# Patient Record
Sex: Female | Born: 1980
Health system: Southern US, Community
[De-identification: ages and names within clinical notes are randomized; demographics above are authoritative.]

## PROBLEM LIST (undated history)

## (undated) DIAGNOSIS — R109 Unspecified abdominal pain: Secondary | ICD-10-CM

## (undated) DIAGNOSIS — F329 Major depressive disorder, single episode, unspecified: Secondary | ICD-10-CM

## (undated) DIAGNOSIS — D649 Anemia, unspecified: Secondary | ICD-10-CM

## (undated) DIAGNOSIS — T7840XA Allergy, unspecified, initial encounter: Secondary | ICD-10-CM

## (undated) DIAGNOSIS — F32A Depression, unspecified: Secondary | ICD-10-CM

## (undated) DIAGNOSIS — R58 Hemorrhage, not elsewhere classified: Secondary | ICD-10-CM

## (undated) HISTORY — DX: Major depressive disorder, single episode, unspecified: F32.9

## (undated) HISTORY — DX: Anemia, unspecified: D64.9

## (undated) HISTORY — DX: Allergy, unspecified, initial encounter: T78.40XA

## (undated) HISTORY — PX: BONE MARROW HARVEST: SHX896

## (undated) HISTORY — PX: TUBAL LIGATION: SHX77

## (undated) HISTORY — DX: Depression, unspecified: F32.A

---

## 1999-06-03 ENCOUNTER — Emergency Department (HOSPITAL_COMMUNITY): Admission: EM | Admit: 1999-06-03 | Discharge: 1999-06-03 | Payer: Self-pay | Admitting: Emergency Medicine

## 2000-04-09 ENCOUNTER — Inpatient Hospital Stay (HOSPITAL_COMMUNITY): Admission: AD | Admit: 2000-04-09 | Discharge: 2000-04-09 | Payer: Self-pay | Admitting: Obstetrics

## 2000-08-27 ENCOUNTER — Other Ambulatory Visit: Admission: RE | Admit: 2000-08-27 | Discharge: 2000-08-27 | Payer: Self-pay | Admitting: Obstetrics and Gynecology

## 2000-10-12 ENCOUNTER — Inpatient Hospital Stay (HOSPITAL_COMMUNITY): Admission: AD | Admit: 2000-10-12 | Discharge: 2000-10-12 | Payer: Self-pay | Admitting: Obstetrics and Gynecology

## 2001-02-26 ENCOUNTER — Inpatient Hospital Stay (HOSPITAL_COMMUNITY): Admission: AD | Admit: 2001-02-26 | Discharge: 2001-02-28 | Payer: Self-pay | Admitting: Obstetrics and Gynecology

## 2001-08-05 ENCOUNTER — Emergency Department (HOSPITAL_COMMUNITY): Admission: EM | Admit: 2001-08-05 | Discharge: 2001-08-05 | Payer: Self-pay

## 2001-08-27 ENCOUNTER — Encounter: Admission: RE | Admit: 2001-08-27 | Discharge: 2001-11-25 | Payer: Self-pay | Admitting: Sports Medicine

## 2003-02-06 ENCOUNTER — Encounter: Payer: Self-pay | Admitting: Emergency Medicine

## 2003-02-06 ENCOUNTER — Inpatient Hospital Stay (HOSPITAL_COMMUNITY): Admission: EM | Admit: 2003-02-06 | Discharge: 2003-02-08 | Payer: Self-pay

## 2003-02-07 ENCOUNTER — Encounter: Payer: Self-pay | Admitting: General Surgery

## 2003-02-25 ENCOUNTER — Encounter: Payer: Self-pay | Admitting: Emergency Medicine

## 2003-02-25 ENCOUNTER — Emergency Department (HOSPITAL_COMMUNITY): Admission: EM | Admit: 2003-02-25 | Discharge: 2003-02-26 | Payer: Self-pay | Admitting: Emergency Medicine

## 2003-03-03 ENCOUNTER — Encounter: Admission: RE | Admit: 2003-03-03 | Discharge: 2003-06-01 | Payer: Self-pay | Admitting: Sports Medicine

## 2003-08-19 ENCOUNTER — Inpatient Hospital Stay (HOSPITAL_COMMUNITY): Admission: AD | Admit: 2003-08-19 | Discharge: 2003-08-19 | Payer: Self-pay | Admitting: Obstetrics

## 2006-09-06 ENCOUNTER — Inpatient Hospital Stay (HOSPITAL_COMMUNITY): Admission: AD | Admit: 2006-09-06 | Discharge: 2006-09-08 | Payer: Self-pay | Admitting: Obstetrics

## 2007-04-07 ENCOUNTER — Inpatient Hospital Stay (HOSPITAL_COMMUNITY): Admission: AD | Admit: 2007-04-07 | Discharge: 2007-04-10 | Payer: Self-pay | Admitting: Obstetrics

## 2007-04-09 ENCOUNTER — Encounter (INDEPENDENT_AMBULATORY_CARE_PROVIDER_SITE_OTHER): Payer: Self-pay | Admitting: Obstetrics

## 2010-11-08 NOTE — Op Note (Signed)
NAMESAHALIE, BETH NO.:  [id number]   MEDICAL RECORD NO.:  [id number]          PATIENT TYPE:  INP   LOCATION:  9148                          FACILITY:  [facility]   PHYSICIAN:  Kathreen Cosier, M.D.DATE OF BIRTH:  1981/03/10   DATE OF PROCEDURE:  04/09/2007  DATE OF DISCHARGE:                               OPERATIVE REPORT   PREOPERATIVE DIAGNOSES:  Multiparity.   PROCEDURE:  Postprocedure tubal ligation.   Using epidural, the patient in supine position, abdomen prepped and  draped, bladder emptied with straight catheter.  Midline subumbilical  incision 1 inch long was made, carried down to the fascia, the fascia  cleaned, grasped with 2 Kochers.  Fascia and the peritoneum opened with  the Mayo scissors.  Left tube grasped in the mid portion with the  Babcock clamp.  Tube traced to the fimbria, 0 plain suture placed in the  mesosalpinx below the portion of tube within the clamp.  This was tied  and approximately 1 inch of tube transected, hemostasis satisfactory.  Procedure done in a similar fashion on the other side.  Lap and sponge  counts correct.  Abdomen closed in layers.  Peritoneum and fascia  continuous with 2-0 Dexon.  Skin closed with subcuticular stitch of 4-0  Monocryl.  The patient tolerated the procedure well, taken to the  recovery room in good condition.           ______________________________  Kathreen Cosier, M.D.     BAM/MEDQ  D:  04/09/2007  T:  04/09/2007  Job:  [id number]
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
## 2010-11-11 NOTE — H&P (Signed)
NAMEJAYANI, [name] NO.:  [id number]   MEDICAL RECORD NO.:  [id number]          PATIENT TYPE:  MAT   LOCATION:  MATC                          [facility]ILITY:  [facility]   PHYSICIAN:  Roseanna Rainbow, M.D.DATE OF BIRTH:  [birth year]   DATE OF ADMISSION:  09/06/2006  DATE OF DISCHARGE:                              HISTORY & PHYSICAL   CHIEF COMPLAINT:  The patient is a 30 year old with an EDC of April 12, 2007, with an intrauterine pregnancy at 8.6 weeks.  The patient is a  gravida 3, para 1, complaining of nausea and vomiting.   HISTORY OF PRESENT ILLNESS:  The patient gives a several-week history of  nausea and vomiting.  She has been prescribed Reglan for her pregnancy-  related nausea and vomiting.  She reports worsening of these symptoms  over the past several days.  She also reports mild abdominal cramping.  Her last solid p.o. intake was this morning.   REVIEW OF SYSTEMS:  GI:  Please see the above.  GU: Noncontributory.   MEDICATIONS:  Please see the medication reconciliation form.   PAST MEDICAL HISTORY:  She denies.   FAMILY HISTORY:  Questionable cancer.   SOCIAL HISTORY:  No drug abuse, current smoker, former drinker.   PAST OB-GYN HISTORY:  Noncontributory.   PAST SURGICAL HISTORY:  Bone marrow aspiration.   PHYSICAL EXAMINATION:  VITAL SIGNS: Weight 168.7 pounds.  Temperature  98.7, pulse 91, blood pressure 105/66, respirations 18.  ABDOMEN: Nontender.  PELVIC:  Exam deferred.   LABORATORY DATA:  Urinalysis: Specific gravity greater than 1.030,  ketones greater than 80.   ASSESSMENT:  1. Early pregnancy; hyperemesis gravidarum.  2. Dehydration.   PLAN:  1. Admission.  2. Will check CBC, CMET, prealbumin, daily weights.  3. IV hydration.  4. Antiemetics.  5. Nutrition consult.  6. Obstetrical ultrasound.      Roseanna Rainbow, M.D.  Electronically Signed     LAJ/MEDQ  D:  09/06/2006  T:  09/06/2006  Job:  [id number]
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
## 2010-11-11 NOTE — H&P (Signed)
[facility]  Patient:    [name], [name] Visit Number: [id number] MRN: [id number]          Service Type: Attending:  Naima A. Normand Sloop, M.D. Dictated by:   Nigel Bridgeman, C.N.M. Adm. Date:  02/26/01                           History and Physical  DATE OF BIRTH:                [birth year]  HISTORY OF PRESENT ILLNESS:   Ms. Glassberg is a 30 year old gravida 2, para 0-0-1-0 at 40-1/7 weeks who presents for induction secondary to SGA and one questionable late deceleration on NST today.  The pregnancy has been remarkable for: 1. SGA diagnosed at approximately 33 weeks. 2. History of 16-week loss. 3. First-trimester VB. 4. First-trimester spotting.  PRENATAL LABORATORY DATA:     Blood type B positive, Rh antibody negative. VDRL nonreactive.  Rubella titer positive.  Hepatitis B surface antigen negative.  HIV nonreactive.  Sickle cell test negative.  GC and chlamydia cultures normal.  Pap normal.  Glucose challenge normal.  AFP normal. Hemoglobin upon entering the practice was 12.1.  It was 12.6 at 26 weeks. Group B strep culture was negative at 36 weeks.  EDC of February 25, 2001, was established by last menstrual period and was in agreement with ultrasound at approximately 17 weeks.  HISTORY OF PRESENT ILLNESS:   The patient entered care at approximately 14 weeks.  She had an ultrasound done at 17 weeks which showed normal growth and development.  Her history was questionably remarkable for an incompetent cervix with her previous pregnancy; however, upon further questioning by the physicians in our practice, no clear evidence of incompetent cervix was noted. The cervix remained within normal limits throughout the early part of her pregnancy.  Glucola was normal.  She had an ultrasound at 35 weeks for size less than dates.  She was found to have normal fluid but growth at the 13-25th percentile.  Cervix was 3.3 cm.  A follow-up ultrasound was done at  38 weeks continuing to show SGA with estimated fetal weight in the 13th percentile. NSTs were done twice a week from that time point.  The cervix was 1 cm, 75%, vertex at a -1 station.  NST today showed reactivity but did have one questionable late deceleration with one contraction.  She had, otherwise, scattered irritability with no decelerations and positive reactivity.  The cervix today was favorable.  The decision was made to admit for induction.  OBSTETRICAL HISTORY:          In January 2000, she had a 16-week miscarriage. She reported a history of a questionable twin gestation at that pregnancy. However, she began to have bleeding and was informed that the baby had died. No other information was available.  She did have anemia with that pregnancy, and she had some vomiting as well.  PAST MEDICAL HISTORY:         She was Depo-Provera until February 2001.  She had chicken pox twice, at age 58 and age 18.  She reports the other usual childhood illnesses.  She does have a history of anemia.  She was a previous smoker prior to her pregnancy.  PAST SURGICAL HISTORY:        D&E in January 2000.  She was a bone-marrow donor in January 1999.  ALLERGIES:  IODINE, which causes HIVES.  FAMILY HISTORY:               Her father has hypertension.  Her mother has a history of leukemia.  Her sister has anemia.  Her father has asthma.  There is a history of borderline diabetes in her family.  Paternal grandmother had some type of cancer.  Her mother has a history of migraines.  GENETIC HISTORY:              Remarkable for her sister dying at age 36 weeks with some type of congenital heart disease.  SOCIAL HISTORY:               The patient is single.  The father of the baby is involved and supportive.  His name is Amador Cunas.  The patient has two years of college.  She is employed at [employer].  Her partner has three years of college.  The patient is African-American, of the  Catholic faith. She has been followed by the certified nurse midwife service at [facility].  She denies any alcohol, drug, or tobacco use since positive EPT.  PHYSICAL EXAMINATION:  VITAL SIGNS:                  Stable.  The patient is afebrile.  HEENT:                        Within normal limits.  LUNGS:                        Bilateral breath sounds are clear.  HEART:                        Regular rate and rhythm, without murmur.  BREASTS:                      Soft and nontender.  ABDOMEN:                      Fundal height is approximately 34 cm.  Estimated fetal weight is 5 pounds.  Uterine contractions are very occasional and mild.  PELVIC:                       Cervical exam is 1-2 cm, 80%, vertex, -1 station, with membranes intact.  Fetal heart rate in the office on NST was reactive with one deceleration on office NST; otherwise, no decelerations were noted.  There was some irritability noted of the uterus.  EXTREMITIES:                  Deep tendon reflexes are 2+ without clonus. There is a trace of edema noted.  IMPRESSION:                   1. Intrauterine pregnancy at 40-1/7 weeks.                               2. Small for gestational age.                               3. Questionable fetal heart rate changes.  4. Favorable cervix.  PLAN:                         1. Admit to birthing suite for consult with                                  Dr. Normand Sloop as attending physician.                               2. Routine certified nurse midwife orders.                               3. Plan Pitocin induction.  The risks and                                  benefits of induction were reviewed with the                                  patient, and she does wish to proceed with                                  this process. Dictated by:   Nigel Bridgeman, C.N.M. Attending:  Naima A. Dillard, M.D. DD:  02/26/01 TD:  02/26/01 Job:  [id number]

## 2011-04-06 LAB — CBC
HCT: 31.4 — ABNORMAL LOW
MCV: 97.4
Platelets: 171
Platelets: 210
RBC: 3.18 — ABNORMAL LOW
RBC: 3.55 — ABNORMAL LOW
WBC: 12 — ABNORMAL HIGH
WBC: 12.8 — ABNORMAL HIGH

## 2011-04-06 LAB — CCBB MATERNAL DONOR DRAW

## 2013-04-02 ENCOUNTER — Encounter: Payer: Self-pay | Admitting: Family

## 2013-04-02 ENCOUNTER — Ambulatory Visit (INDEPENDENT_AMBULATORY_CARE_PROVIDER_SITE_OTHER): Payer: BC Managed Care – PPO | Admitting: Family

## 2013-04-02 VITALS — BP 126/80 | HR 90 | Ht 66.0 in | Wt 147.0 lb

## 2013-04-02 DIAGNOSIS — D510 Vitamin B12 deficiency anemia due to intrinsic factor deficiency: Secondary | ICD-10-CM | POA: Insufficient documentation

## 2013-04-02 DIAGNOSIS — F329 Major depressive disorder, single episode, unspecified: Secondary | ICD-10-CM

## 2013-04-02 DIAGNOSIS — F411 Generalized anxiety disorder: Secondary | ICD-10-CM

## 2013-04-02 LAB — CBC WITH DIFFERENTIAL/PLATELET
Basophils Relative: 0.4 % (ref 0.0–3.0)
HCT: 38 % (ref 36.0–46.0)
Hemoglobin: 13 g/dL (ref 12.0–15.0)
Lymphocytes Relative: 23.7 % (ref 12.0–46.0)
Lymphs Abs: 1.6 10*3/uL (ref 0.7–4.0)
MCHC: 34.2 g/dL (ref 30.0–36.0)
Monocytes Relative: 10.6 % (ref 3.0–12.0)
Neutro Abs: 4.4 10*3/uL (ref 1.4–7.7)
RBC: 3.68 Mil/uL — ABNORMAL LOW (ref 3.87–5.11)

## 2013-04-02 LAB — HEPATIC FUNCTION PANEL
ALT: 58 U/L — ABNORMAL HIGH (ref 0–35)
AST: 55 U/L — ABNORMAL HIGH (ref 0–37)
Bilirubin, Direct: 0.3 mg/dL (ref 0.0–0.3)
Total Bilirubin: 2.6 mg/dL — ABNORMAL HIGH (ref 0.3–1.2)

## 2013-04-02 MED ORDER — PAROXETINE HCL 10 MG PO TABS: 10.0000 mg | ORAL_TABLET | ORAL | Status: DC

## 2013-04-02 MED ORDER — CHLORDIAZEPOXIDE HCL 5 MG PO CAPS: 5.0000 mg | ORAL_CAPSULE | Freq: Three times a day (TID) | ORAL | Status: DC | PRN

## 2013-04-02 MED ORDER — ONDANSETRON HCL 8 MG PO TABS: 8.0000 mg | ORAL_TABLET | Freq: Three times a day (TID) | ORAL | Status: DC | PRN

## 2013-04-02 NOTE — Patient Instructions (Addendum)

## 2013-04-02 NOTE — Progress Notes (Signed)
  Subjective:    Patient ID: [name], female    DOB: [birth year], 32 y.o.   MRN: [id number]  HPI 32 year old [place] American female, new patient to the practice and to be established. She has a history of anxiety, depression, alcohol dependence. She recently broke up with her boyfriend and is having a difficult time coping. In the past medications and suggested to her to take but she has not taken it. She has feelings of helplessness, hopelessness but denies any thoughts of death or dying or any intention to commit suicide.   Review of Systems  Constitutional: Negative.   Respiratory: Negative.   Cardiovascular: Negative.   Skin: Negative.   Neurological: Negative.   Psychiatric/Behavioral: Positive for sleep disturbance and agitation. The patient is nervous/anxious.    Past Medical History  Diagnosis Date  . Depression     History   Social History  . Marital Status: Single    Spouse Name: N/A    Number of Children: N/A  . Years of Education: N/A   Occupational History  . Not on file.   Social History Main Topics  . Smoking status: Never Smoker   . Smokeless tobacco: Not on file  . Alcohol Use: Yes  . Drug Use: No  . Sexual Activity: Not on file   Other Topics Concern  . Not on file   Social History Narrative  . No narrative on file    Past Surgical History  Procedure Laterality Date  . Tubal ligation      No family history on file.  Allergies  Allergen Reactions  . Iodine     No current outpatient prescriptions on file prior to visit.   No current facility-administered medications on file prior to visit.    BP 126/80  Pulse 90  Ht 5\' 6"  (1.676 m)  Wt 147 lb (66.679 kg)  BMI 23.74 kg/m2chart    Objective:   Physical Exam  Constitutional: She is oriented to person, place, and time. She appears well-developed and well-nourished.  Neck: Normal range of motion. Neck supple.  Cardiovascular: Normal rate, regular rhythm and normal heart sounds.    Pulmonary/Chest: Effort normal and breath sounds normal.  Musculoskeletal: Normal range of motion.  Neurological: She is alert and oriented to person, place, and time.  Skin: Skin is warm and dry.  Psychiatric: She has a normal mood and affect.          Assessment & Plan:  Assessment: 1. Anxiety 2. Depression 3. History of alcohol dependence  Plan: Paxil 10 mg one tablet daily. Librium 5 mg 3 times a day as needed. Zofran as needed for nausea. Patient the office with any questions or concerns. Recheck as scheduled, in 10 days and sooner if needed.

## 2013-04-03 ENCOUNTER — Other Ambulatory Visit: Payer: Self-pay

## 2013-04-04 ENCOUNTER — Telehealth: Payer: Self-pay | Admitting: Family

## 2013-04-04 NOTE — Telephone Encounter (Signed)
Abd [place] order needs to be change from "Limited" to "Complete".  She said can schedule the pt once the order is changed.  Thanks a bunch.  Archie Patten

## 2013-04-04 NOTE — Telephone Encounter (Signed)
Order changed.

## 2013-04-07 ENCOUNTER — Ambulatory Visit (INDEPENDENT_AMBULATORY_CARE_PROVIDER_SITE_OTHER)
Admission: RE | Admit: 2013-04-07 | Discharge: 2013-04-07 | Disposition: A | Discharge status: Home / Self Care | Payer: BC Managed Care – PPO | Source: Ambulatory Visit | Attending: Family | Admitting: Family

## 2013-04-07 ENCOUNTER — Ambulatory Visit
Admission: RE | Admit: 2013-04-07 | Discharge: 2013-04-07 | Disposition: A | Discharge status: Home / Self Care | Payer: BC Managed Care – PPO | Source: Ambulatory Visit | Attending: Family | Admitting: Family

## 2013-04-07 ENCOUNTER — Other Ambulatory Visit: Payer: Self-pay | Admitting: Family

## 2013-04-07 DIAGNOSIS — R160 Hepatomegaly, not elsewhere classified: Secondary | ICD-10-CM

## 2013-04-07 DIAGNOSIS — K769 Liver disease, unspecified: Secondary | ICD-10-CM

## 2013-04-07 MED ORDER — IOHEXOL 350 MG/ML SOLN
100.0000 mL | Freq: Once | INTRAVENOUS | Status: AC | PRN
  Administered 2013-04-07: 100 mL via INTRAVENOUS

## 2013-04-08 ENCOUNTER — Ambulatory Visit (HOSPITAL_BASED_OUTPATIENT_CLINIC_OR_DEPARTMENT_OTHER): Payer: BC Managed Care – PPO

## 2013-04-08 ENCOUNTER — Other Ambulatory Visit: Payer: Self-pay | Admitting: Geriatric Medicine

## 2013-04-08 ENCOUNTER — Other Ambulatory Visit: Payer: Self-pay | Admitting: Family

## 2013-04-08 ENCOUNTER — Other Ambulatory Visit: Payer: Self-pay

## 2013-04-08 ENCOUNTER — Telehealth: Payer: Self-pay | Admitting: Family

## 2013-04-08 DIAGNOSIS — R160 Hepatomegaly, not elsewhere classified: Secondary | ICD-10-CM

## 2013-04-08 DIAGNOSIS — R1900 Intra-abdominal and pelvic swelling, mass and lump, unspecified site: Secondary | ICD-10-CM

## 2013-04-08 NOTE — Telephone Encounter (Signed)
Noted  

## 2013-04-08 NOTE — Telephone Encounter (Signed)
[name] from [facility] called to inform you that patient cancelled her MRI for today. [name] believes it was due to money issues.

## 2013-10-21 ENCOUNTER — Encounter: Payer: Self-pay | Admitting: Family

## 2013-10-21 ENCOUNTER — Ambulatory Visit (INDEPENDENT_AMBULATORY_CARE_PROVIDER_SITE_OTHER): Payer: BC Managed Care – PPO | Admitting: Family

## 2013-10-21 VITALS — BP 118/64 | HR 78 | Temp 98.3°F | Ht 66.0 in | Wt 127.0 lb

## 2013-10-21 DIAGNOSIS — F430 Acute stress reaction: Secondary | ICD-10-CM

## 2013-10-21 NOTE — Progress Notes (Signed)
   Subjective:    Patient ID: [name], female    DOB: 07-27-1980, 33 y.o.   MRN: [id number]  HPI 33 year old [place] American female, nonsmoker is in today requesting FMLA paperwork to be completed on her behalf for missing work. She has a daughter with allergic rhinitis currently under the care of the allergist that has caused her to miss significant amount of work over the last 6 months. This is causing stress and fear of loosing her job at [employer].    Review of Systems  Constitutional: Negative.   Respiratory: Negative.   Cardiovascular: Negative.   Endocrine: Negative.   Genitourinary: Negative.   Musculoskeletal: Negative.   Skin: Negative.   Allergic/Immunologic: Negative.   Neurological: Negative.   Hematological: Negative.   Psychiatric/Behavioral: Positive for sleep disturbance. The patient is nervous/anxious.    Past Medical History  Diagnosis Date  . Depression     History   Social History  . Marital Status: Single    Spouse Name: N/A    Number of Children: N/A  . Years of Education: N/A   Occupational History  . Not on file.   Social History Main Topics  . Smoking status: Never Smoker   . Smokeless tobacco: Not on file  . Alcohol Use: Yes  . Drug Use: No  . Sexual Activity: Not on file   Other Topics Concern  . Not on file   Social History Narrative  . No narrative on file    Past Surgical History  Procedure Laterality Date  . Tubal ligation      No family history on file.  Allergies  Allergen Reactions  . Iodine     Current Outpatient Prescriptions on File Prior to Visit  Medication Sig Dispense Refill  . chlordiazePOXIDE (LIBRIUM) 5 MG capsule Take 1 capsule (5 mg total) by mouth 3 (three) times daily as needed for anxiety.  30 capsule  0  . ondansetron (ZOFRAN) 8 MG tablet Take 1 tablet (8 mg total) by mouth every 8 (eight) hours as needed for nausea.  30 tablet  0  . PARoxetine (PAXIL) 10 MG tablet Take 1 tablet (10 mg total) by  mouth every morning.  30 tablet  1   No current facility-administered medications on file prior to visit.    There were no vitals taken for this visit.chart    Objective:   Physical Exam  Constitutional: She is oriented to person, place, and time. She appears well-developed and well-nourished.  HENT:  Right Ear: External ear normal.  Left Ear: External ear normal.  Nose: Nose normal.  Mouth/Throat: Oropharynx is clear and moist.  Neck: Normal range of motion. Neck supple.  Cardiovascular: Normal rate, regular rhythm and normal heart sounds.   Pulmonary/Chest: Effort normal and breath sounds normal.  Neurological: She is alert and oriented to person, place, and time.  Skin: Skin is warm and dry.  Psychiatric: She has a normal mood and affect.          Assessment & Plan:  Assessment:  Acute Stress Reaction  Plan: FMLA paperwork to be completed and faxed based upon stress and her daughter's illness.

## 2014-01-13 IMAGING — US US ABDOMEN COMPLETE
1 series · 13 of 25 positions shown · non-contrast
Comparison: None

CLINICAL DATA: Elevated LFTs.

EXAM:
ULTRASOUND ABDOMEN COMPLETE

[Series 1: us abdomen complete · 0.32mm/px · 13 of 72 slices shown]
[im 1/72]
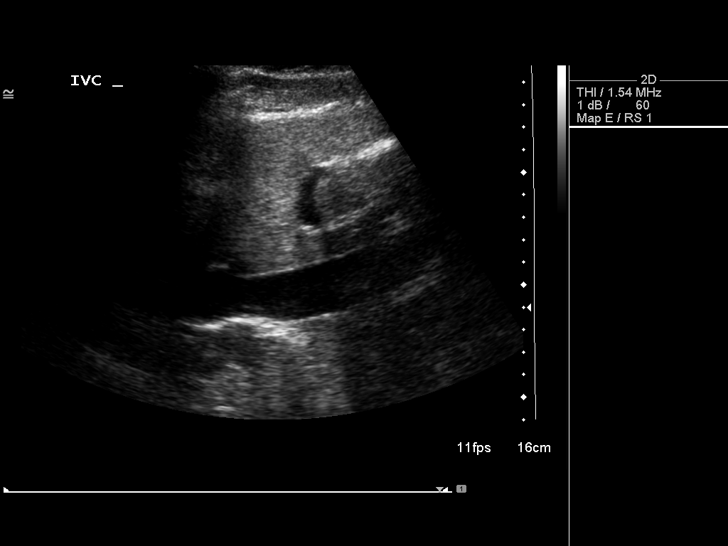
[im 6/72]
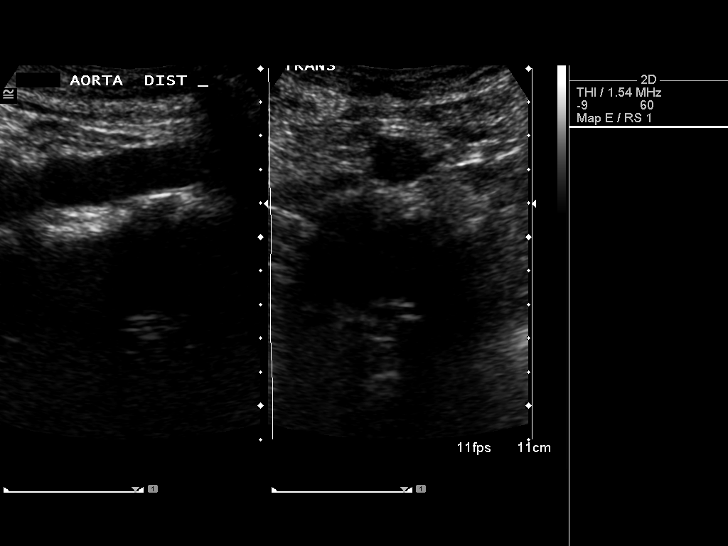
[im 12/72]
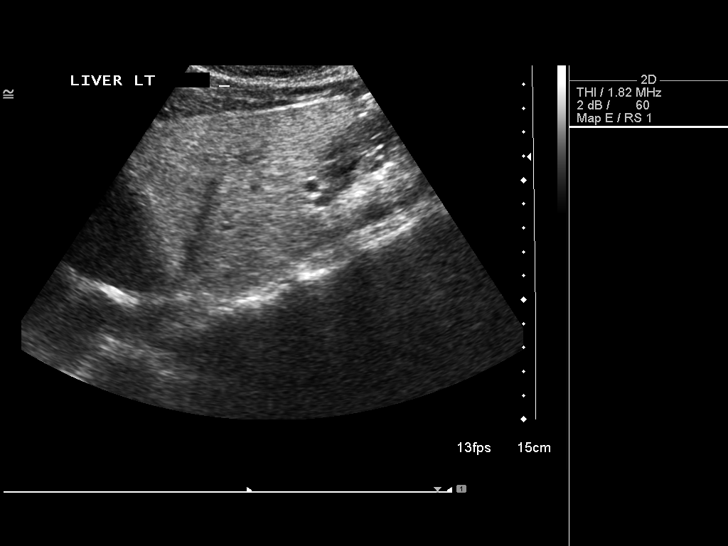
[im 18/72]
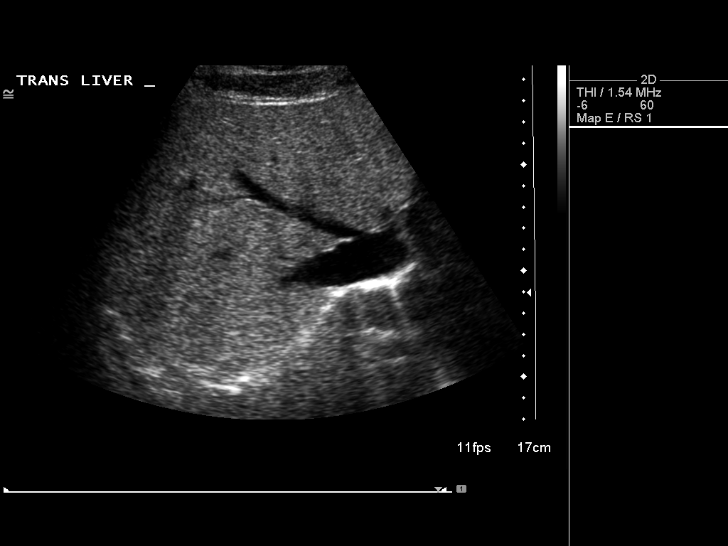
[im 24/72]
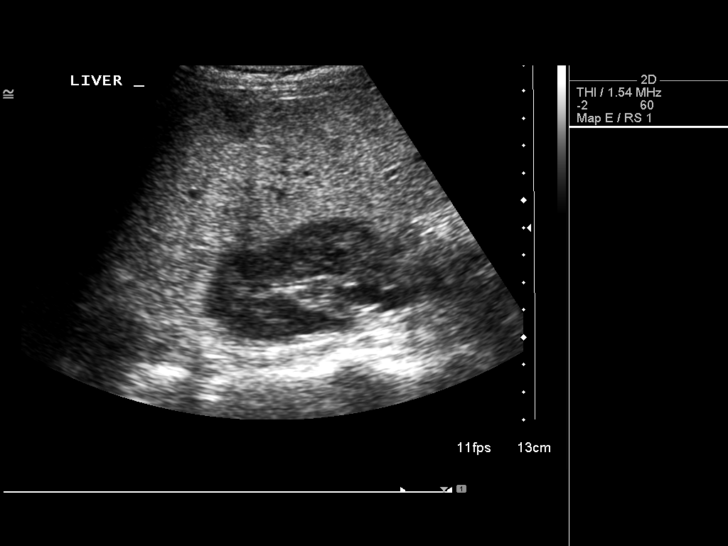
[im 30/72]
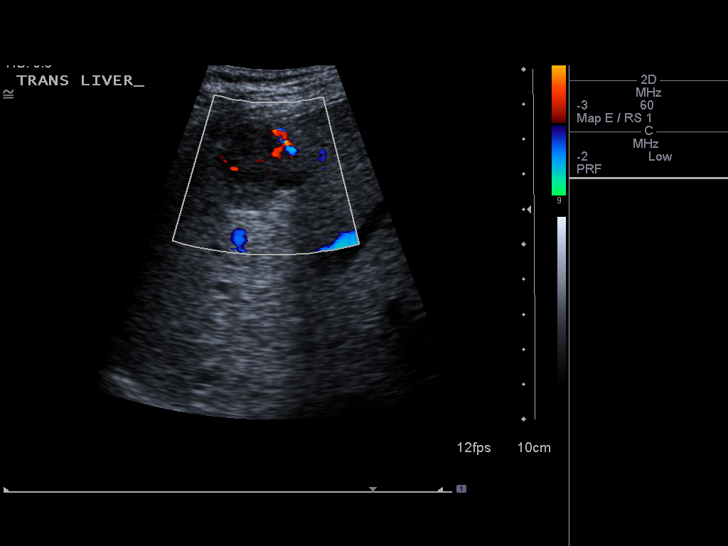
[im 36/72]
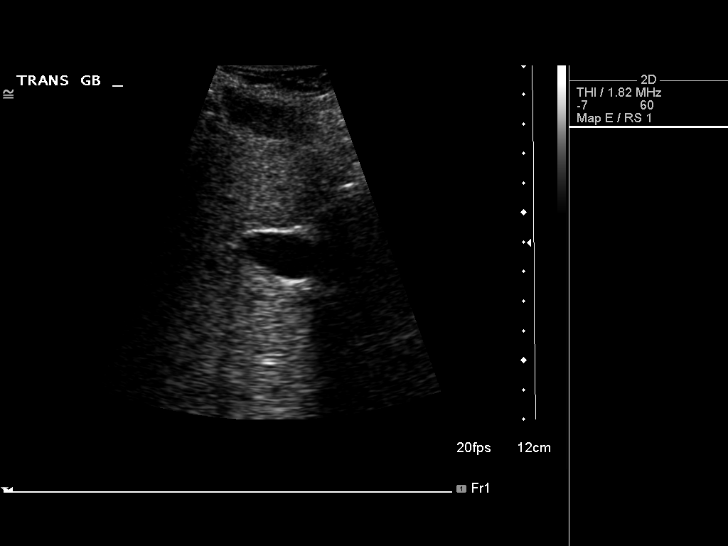
[im 42/72]
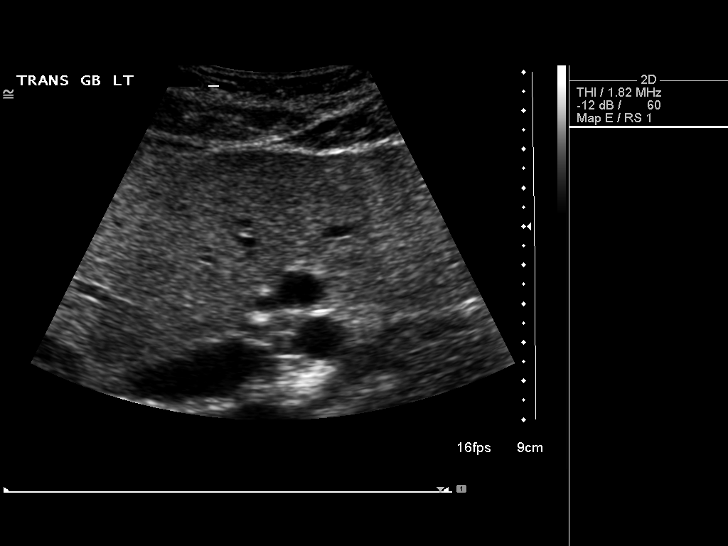
[im 48/72]
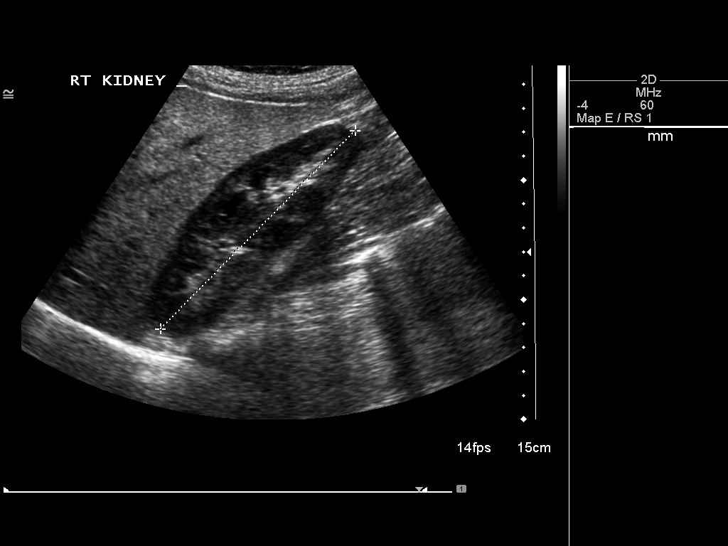
[im 54/72]
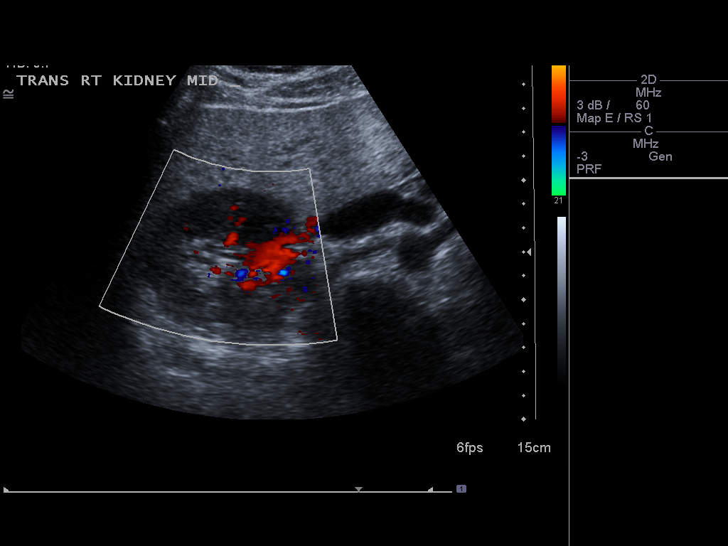
[im 60/72]
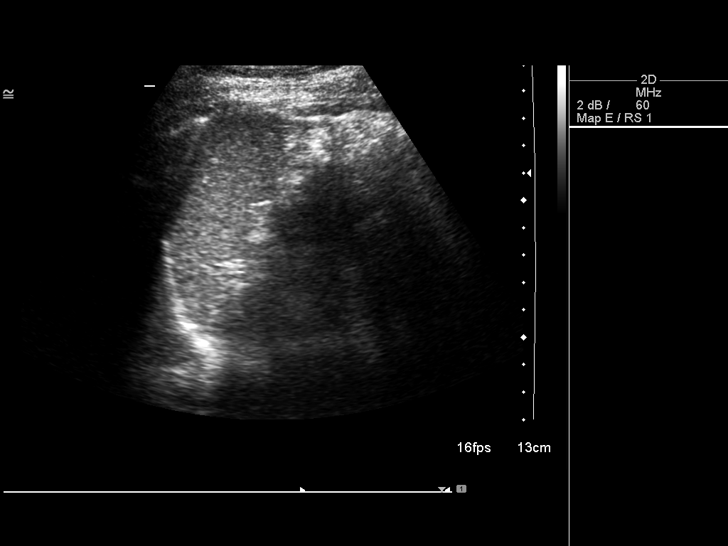
[im 66/72]
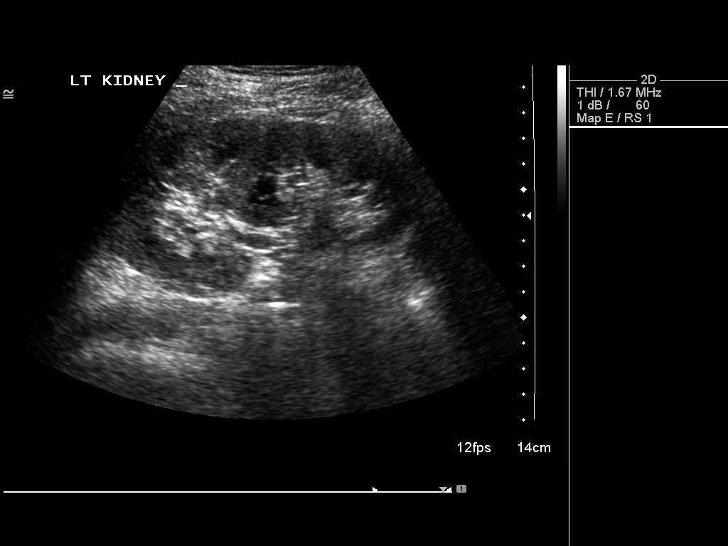
[im 72/72]
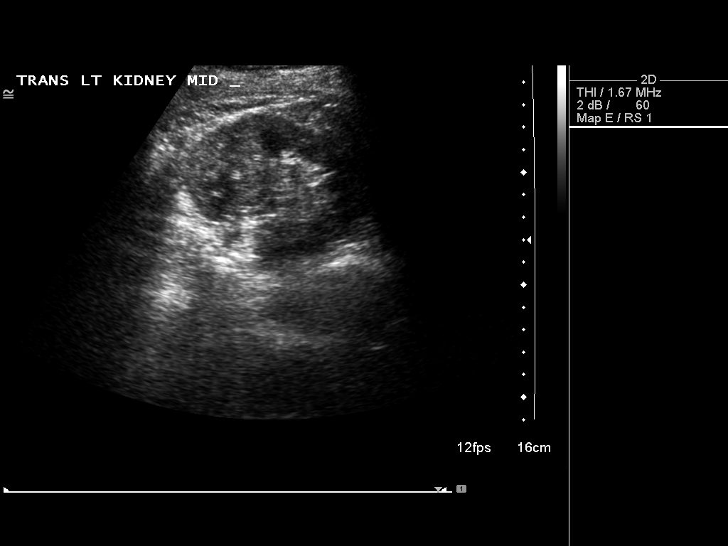

[13 of 25 positions shown; findings below may reference images not displayed]

FINDINGS: Gallbladder

No gallstones or wall thickening. Negative sonographic Murphy's
sign.

Common bile duct

Diameter: Measures 2 mm, otherwise unremarkable. .

Liver

Diffusely increased in echogenicity. There is a hypoechoic solid
lesion within the right hepatic lobe superior and laterally
measuring 2.6 x 2.4 x 2.8 cm. There is some internal vascularity
demonstrated within this lesion.

IVC

No abnormality visualized.

Pancreas

Visualized portion unremarkable.

Spleen

Size and appearance within normal limits.

Right Kidney

Length: Measures 11.6 cm Echogenicity within normal limits. No mass
or hydronephrosis visualized.

Left Kidney

Length: Measures 11.9 cm Echogenicity within normal limits. No mass
or hydronephrosis visualized.

Abdominal aorta

No aneurysm visualized.
IMPRESSION: 1. There is a 2.8 cm irregular solid lesion within the right hepatic
lobe with internal vascularity. This needs further
evaluation/characterization with MRI or potentially CT.
Considerations include primary hepatic masses or metastatic disease.
2. Diffusely increased hepatic echogenicity as can be seen with
hepatic steatosis.
These results will be called to the ordering clinician or
representative by the Radiologist Assistant, and communication
documented in the PACS Dashboard.

## 2014-02-26 ENCOUNTER — Other Ambulatory Visit: Payer: Self-pay | Admitting: Family

## 2014-02-26 MED ORDER — METRONIDAZOLE 0.75 % VA GEL: 1.0000 | Freq: Every day | VAGINAL | Status: AC

## 2014-08-17 ENCOUNTER — Encounter: Payer: Self-pay | Admitting: *Deleted

## 2014-08-17 ENCOUNTER — Other Ambulatory Visit: Payer: Self-pay | Admitting: *Deleted

## 2014-08-17 MED ORDER — SULFAMETHOXAZOLE-TRIMETHOPRIM 800-160 MG PO TABS: 1.0000 | ORAL_TABLET | Freq: Two times a day (BID) | ORAL | Status: DC

## 2014-11-27 ENCOUNTER — Other Ambulatory Visit: Payer: Self-pay | Admitting: Family

## 2014-11-27 ENCOUNTER — Other Ambulatory Visit (INDEPENDENT_AMBULATORY_CARE_PROVIDER_SITE_OTHER): Payer: Self-pay

## 2014-11-27 ENCOUNTER — Other Ambulatory Visit: Payer: Self-pay

## 2014-11-27 DIAGNOSIS — F1020 Alcohol dependence, uncomplicated: Secondary | ICD-10-CM | POA: Insufficient documentation

## 2014-11-27 DIAGNOSIS — T148XXA Other injury of unspecified body region, initial encounter: Secondary | ICD-10-CM

## 2014-11-27 DIAGNOSIS — R7989 Other specified abnormal findings of blood chemistry: Secondary | ICD-10-CM

## 2014-11-27 DIAGNOSIS — F10229 Alcohol dependence with intoxication, unspecified: Secondary | ICD-10-CM

## 2014-11-27 DIAGNOSIS — R945 Abnormal results of liver function studies: Secondary | ICD-10-CM

## 2014-11-27 DIAGNOSIS — R739 Hyperglycemia, unspecified: Secondary | ICD-10-CM

## 2014-11-27 DIAGNOSIS — T148 Other injury of unspecified body region: Secondary | ICD-10-CM

## 2014-11-27 LAB — CBC WITH DIFFERENTIAL/PLATELET
BASOS ABS: 0 10*3/uL (ref 0.0–0.1)
Basophils Relative: 0.3 % (ref 0.0–3.0)
EOS ABS: 0 10*3/uL (ref 0.0–0.7)
EOS PCT: 0.7 % (ref 0.0–5.0)
HCT: 38.3 % (ref 36.0–46.0)
Hemoglobin: 13.2 g/dL (ref 12.0–15.0)
LYMPHS ABS: 2 10*3/uL (ref 0.7–4.0)
LYMPHS PCT: 37.4 % (ref 12.0–46.0)
MCHC: 34.3 g/dL (ref 30.0–36.0)
MCV: 105.8 fl — AB (ref 78.0–100.0)
Monocytes Absolute: 0.6 10*3/uL (ref 0.1–1.0)
Monocytes Relative: 11.8 % (ref 3.0–12.0)
NEUTROS ABS: 2.6 10*3/uL (ref 1.4–7.7)
NEUTROS PCT: 49.8 % (ref 43.0–77.0)
Platelets: 166 10*3/uL (ref 150.0–400.0)
RBC: 3.63 Mil/uL — ABNORMAL LOW (ref 3.87–5.11)
RDW: 13.5 % (ref 11.5–15.5)
WBC: 5.3 10*3/uL (ref 4.0–10.5)

## 2014-11-27 LAB — BASIC METABOLIC PANEL
BUN: 7 mg/dL (ref 6–23)
CO2: 29 meq/L (ref 19–32)
Calcium: 8.9 mg/dL (ref 8.4–10.5)
Chloride: 100 mEq/L (ref 96–112)
Creatinine, Ser: 0.58 mg/dL (ref 0.40–1.20)
GFR: 152.63 mL/min (ref >60.00)
GLUCOSE: 143 mg/dL — AB (ref 70–99)
POTASSIUM: 3.2 meq/L — AB (ref 3.5–5.1)
SODIUM: 139 meq/L (ref 135–145)

## 2014-11-27 LAB — HEMOGLOBIN A1C: Hgb A1c MFr Bld: 4.9 % (ref 4.6–6.5)

## 2014-11-27 LAB — PROTIME-INR
INR: 1.1 ratio — AB (ref 0.8–1.0)
PROTHROMBIN TIME: 11.9 s (ref 9.6–13.1)

## 2014-11-27 LAB — HEPATIC FUNCTION PANEL
ALK PHOS: 48 U/L (ref 39–117)
ALT: 111 U/L — AB (ref 0–35)
AST: 145 U/L — ABNORMAL HIGH (ref 0–37)
Albumin: 4.6 g/dL (ref 3.5–5.2)
Bilirubin, Direct: 0.1 mg/dL (ref 0.0–0.3)
Total Bilirubin: 0.5 mg/dL (ref 0.2–1.2)
Total Protein: 7.5 g/dL (ref 6.0–8.3)

## 2014-11-27 MED ORDER — POTASSIUM CHLORIDE CRYS ER 20 MEQ PO TBCR: 20.0000 meq | EXTENDED_RELEASE_TABLET | Freq: Every day | ORAL | Status: DC

## 2014-12-11 ENCOUNTER — Telehealth: Payer: Self-pay | Admitting: Family

## 2014-12-11 NOTE — Telephone Encounter (Signed)
34 year old patient with a history of alcohol dependence, drinking 1/2 gallon of alcohol per day call requesting to be referred to inpatient rehab. Reports tired of feeling down but cannot shake her addiction alone. Advised we will seek inpatient therapy for her. Pt. Agrees. Tamesha checking into facilities for placement.

## 2014-12-14 ENCOUNTER — Other Ambulatory Visit: Payer: Self-pay

## 2014-12-14 ENCOUNTER — Inpatient Hospital Stay: Admission: RE | Admit: 2014-12-14 | Payer: Self-pay | Source: Ambulatory Visit

## 2014-12-14 ENCOUNTER — Ambulatory Visit
Admission: RE | Admit: 2014-12-14 | Discharge: 2014-12-14 | Disposition: A | Discharge status: Home / Self Care | Payer: No Typology Code available for payment source | Source: Ambulatory Visit | Attending: Family | Admitting: Family

## 2014-12-14 DIAGNOSIS — T148XXA Other injury of unspecified body region, initial encounter: Secondary | ICD-10-CM

## 2014-12-14 DIAGNOSIS — K7689 Other specified diseases of liver: Secondary | ICD-10-CM

## 2014-12-14 DIAGNOSIS — K219 Gastro-esophageal reflux disease without esophagitis: Secondary | ICD-10-CM

## 2014-12-14 DIAGNOSIS — F10229 Alcohol dependence with intoxication, unspecified: Secondary | ICD-10-CM

## 2014-12-14 MED ORDER — OMEPRAZOLE 40 MG PO CPDR: 40.0000 mg | DELAYED_RELEASE_CAPSULE | Freq: Every day | ORAL | Status: DC

## 2014-12-14 NOTE — Telephone Encounter (Signed)
Referral form faxed to [facility]

## 2015-02-19 ENCOUNTER — Ambulatory Visit: Payer: No Typology Code available for payment source | Admitting: Gastroenterology

## 2015-04-11 ENCOUNTER — Encounter (HOSPITAL_BASED_OUTPATIENT_CLINIC_OR_DEPARTMENT_OTHER): Payer: Self-pay | Admitting: Emergency Medicine

## 2015-04-11 ENCOUNTER — Emergency Department (HOSPITAL_BASED_OUTPATIENT_CLINIC_OR_DEPARTMENT_OTHER)
Admission: EM | Admit: 2015-04-11 | Discharge: 2015-04-11 | Disposition: A | Discharge status: Home / Self Care | Payer: No Typology Code available for payment source | Attending: Emergency Medicine | Admitting: Emergency Medicine

## 2015-04-11 DIAGNOSIS — G8929 Other chronic pain: Secondary | ICD-10-CM | POA: Insufficient documentation

## 2015-04-11 DIAGNOSIS — Z79899 Other long term (current) drug therapy: Secondary | ICD-10-CM | POA: Insufficient documentation

## 2015-04-11 DIAGNOSIS — R200 Anesthesia of skin: Secondary | ICD-10-CM | POA: Insufficient documentation

## 2015-04-11 DIAGNOSIS — M549 Dorsalgia, unspecified: Secondary | ICD-10-CM

## 2015-04-11 DIAGNOSIS — Z8659 Personal history of other mental and behavioral disorders: Secondary | ICD-10-CM | POA: Insufficient documentation

## 2015-04-11 DIAGNOSIS — M6281 Muscle weakness (generalized): Secondary | ICD-10-CM | POA: Insufficient documentation

## 2015-04-11 DIAGNOSIS — M545 Low back pain: Secondary | ICD-10-CM | POA: Insufficient documentation

## 2015-04-11 MED ORDER — IBUPROFEN 800 MG PO TABS: 800.0000 mg | ORAL_TABLET | Freq: Three times a day (TID) | ORAL | Status: DC

## 2015-04-11 MED ORDER — IBUPROFEN 800 MG PO TABS
800.0000 mg | ORAL_TABLET | Freq: Once | ORAL | Status: AC
  Administered 2015-04-11: 800 mg via ORAL
  Filled 2015-04-11: qty 1

## 2015-04-11 MED ORDER — METHYLPREDNISOLONE 4 MG PO TBPK: ORAL_TABLET | ORAL | Status: DC

## 2015-04-11 MED ORDER — CYCLOBENZAPRINE HCL 10 MG PO TABS
5.0000 mg | ORAL_TABLET | Freq: Once | ORAL | Status: AC
  Administered 2015-04-11: 5 mg via ORAL
  Filled 2015-04-11: qty 1

## 2015-04-11 MED ORDER — CYCLOBENZAPRINE HCL 10 MG PO TABS: 10.0000 mg | ORAL_TABLET | Freq: Two times a day (BID) | ORAL | Status: DC | PRN
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
## 2015-04-11 NOTE — Discharge Instructions (Signed)

## 2015-09-21 IMAGING — US US ABDOMEN LIMITED
1 series · 14 of 25 positions shown · non-contrast
Comparison: CT 04/07/2013.  Ultrasound 04/07/2013.

CLINICAL DATA: Elevated LFTs .

EXAM:
US ABDOMEN LIMITED - RIGHT UPPER QUADRANT

[Series 1: us abdomen limited · 0.26mm/px · 14 of 56 slices shown]
[im 1/56]
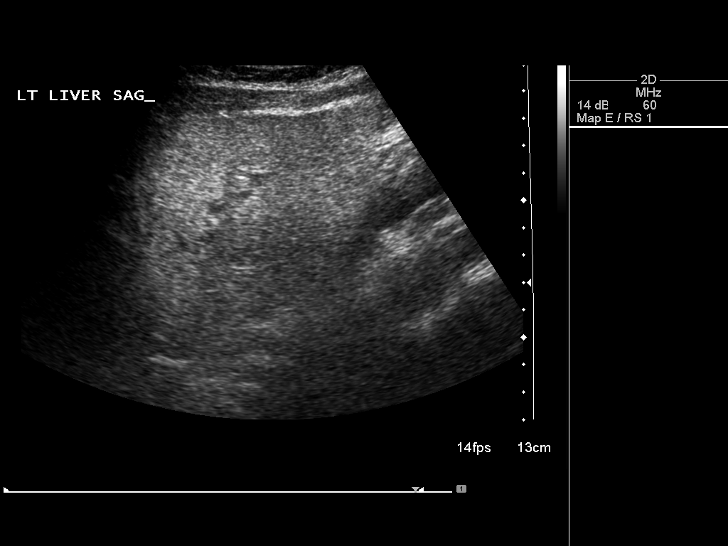
[im 5/56]
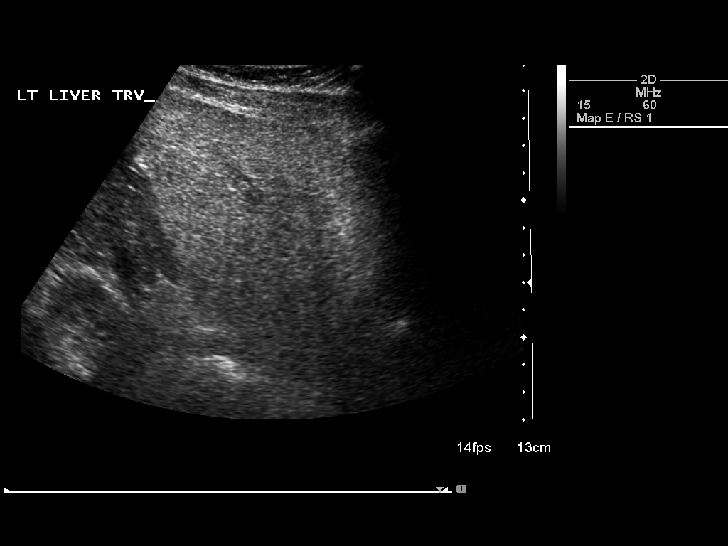
[im 10/56]
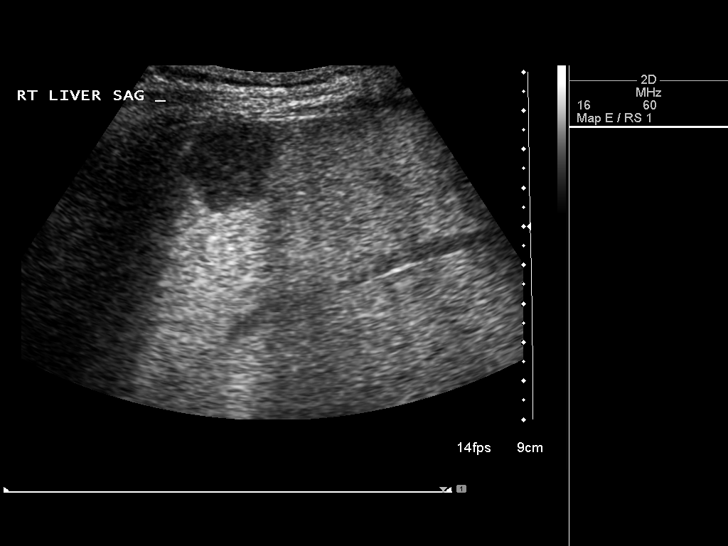
[im 14/56]
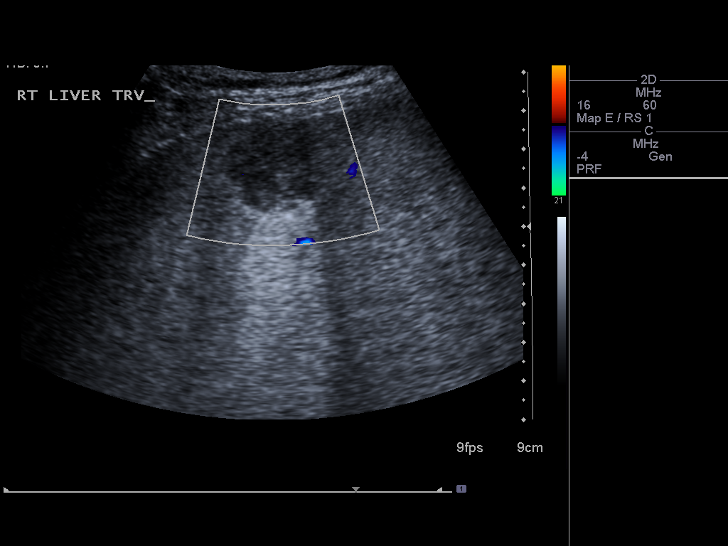
[im 19/56]
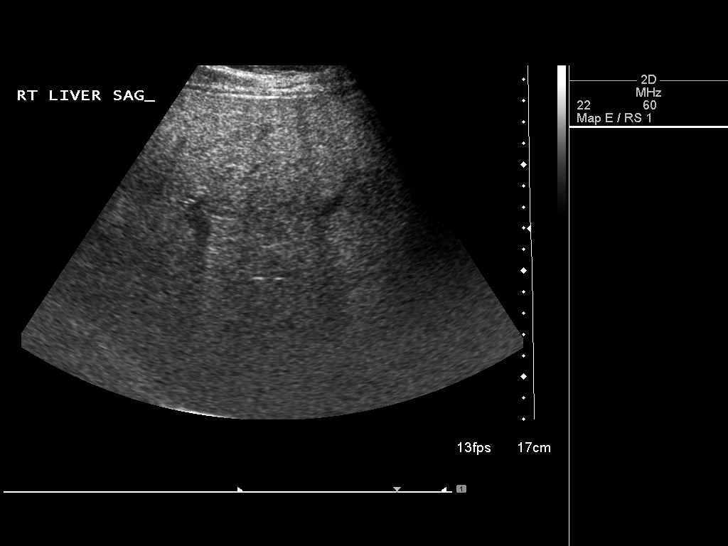
[im 21/56]
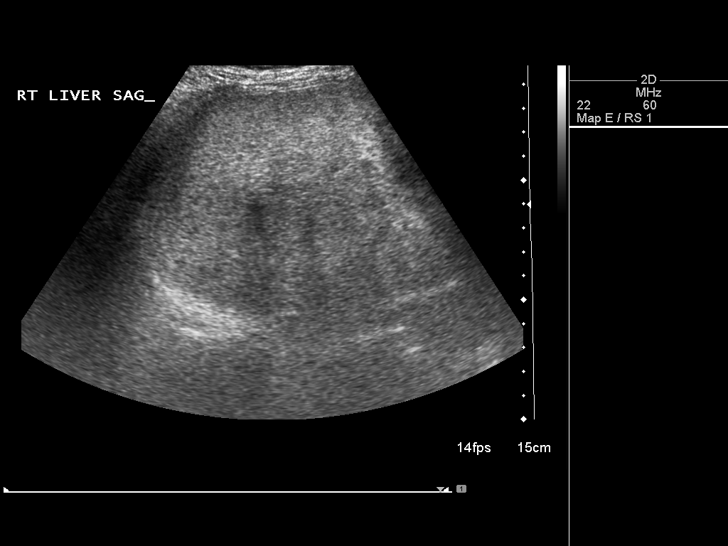
[im 26/56]
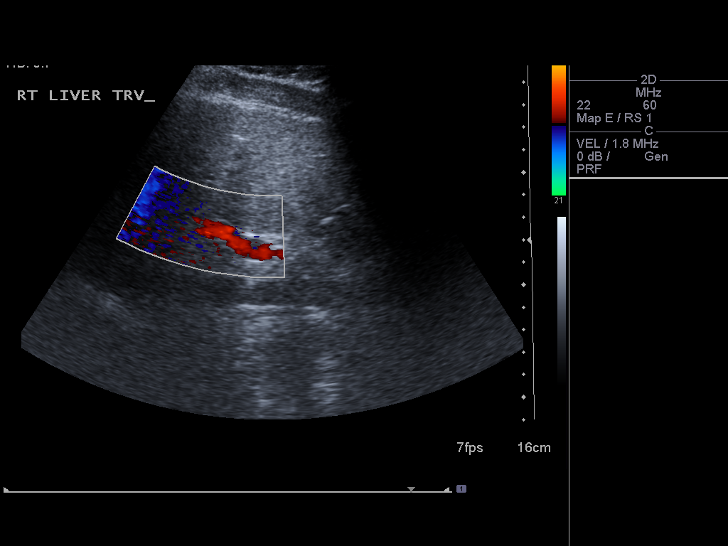
[im 30/56]
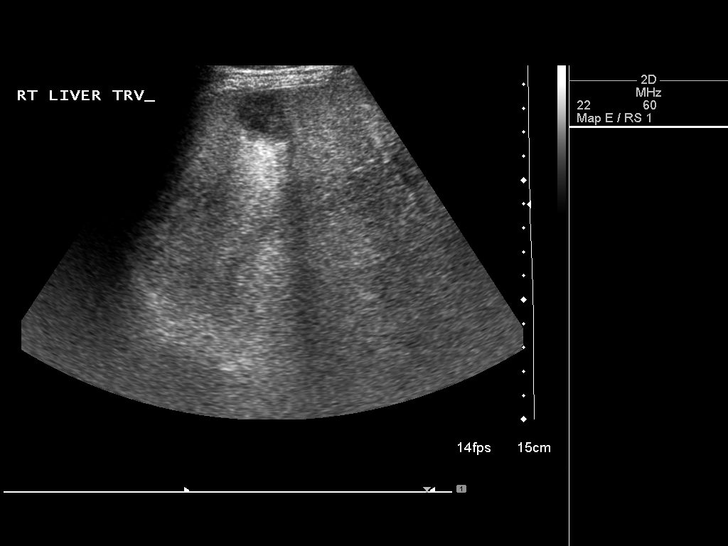
[im 35/56]
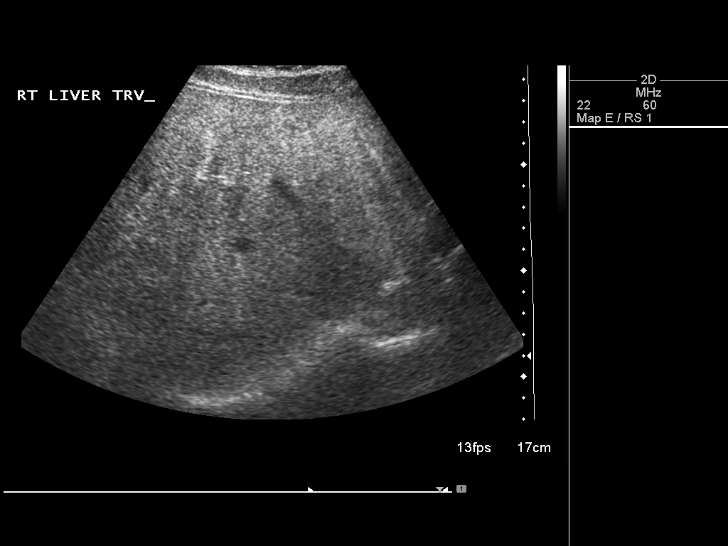
[im 37/56]
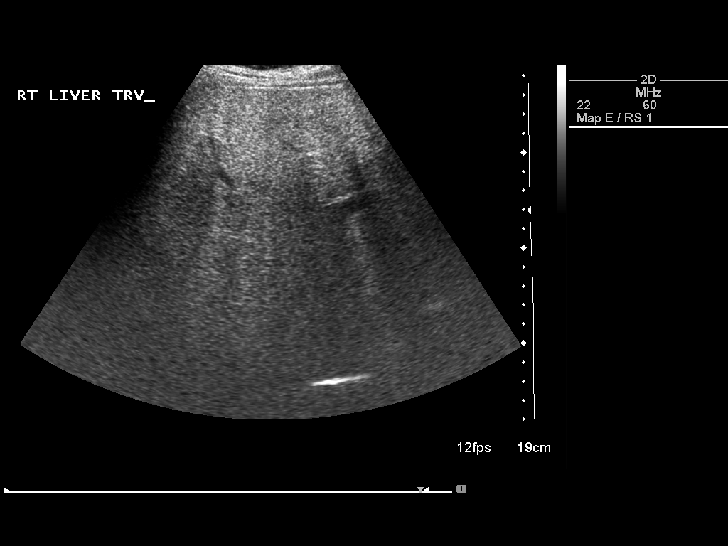
[im 42/56]
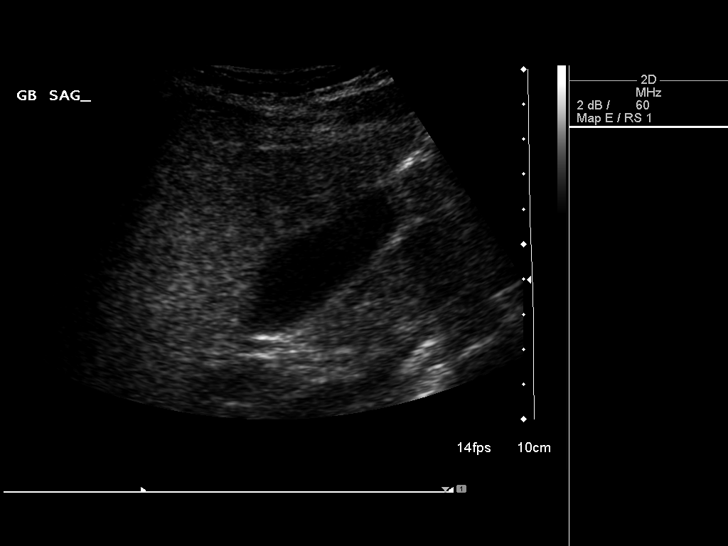
[im 46/56]
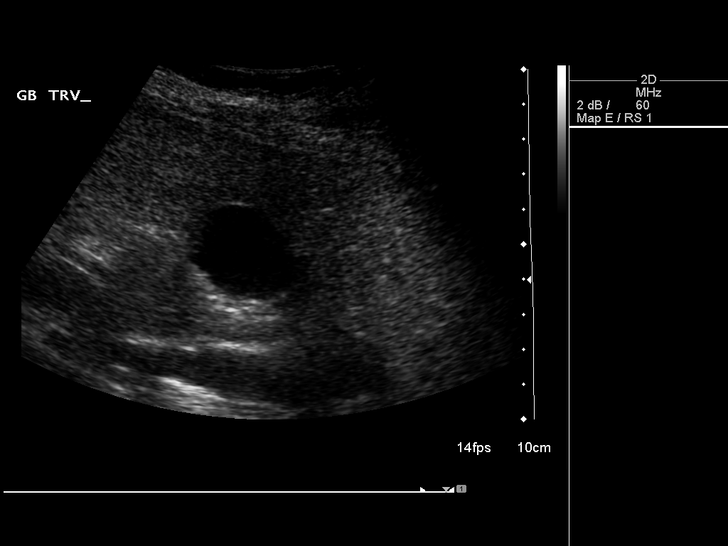
[im 51/56]
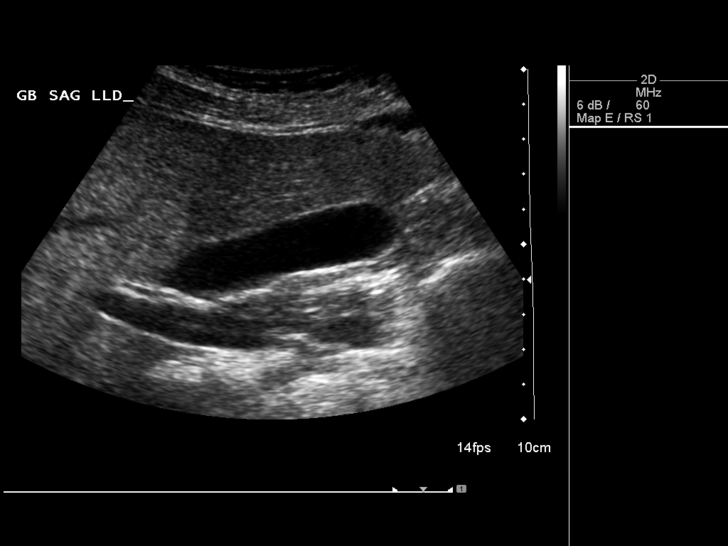
[im 56/56]
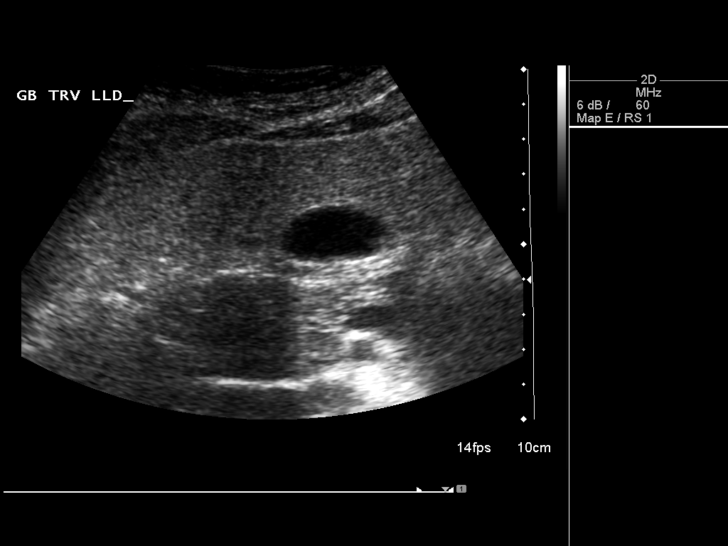

[14 of 25 positions shown; findings below may reference images not displayed]

FINDINGS: Gallbladder:

No gallstones or wall thickening visualized. No sonographic Murphy
sign noted.

Common bile duct:

Diameter: 3.3 mm.

Liver:

Liver is echogenic consistent with fatty infiltration and/or
hepatocellular disease. There is a stable 2.6 x 2.6 x 2.5 cm
hypoechoic mass in the anterior aspect of the right lobe of the
liver. This is unchanged from prior ultrasound of 04/07/2013.
Reference is made to prior CT report 04/07/2013.
IMPRESSION: 1. Hyperechoic liver consistent with fatty infiltration and/or
hepatocellular disease. Stable 2.6 cm hypoechoic masses in the
anterior aspect of the right lobe of the liver. This lesion is
stable from prior ultrasound 04/07/2013. Reference is made to prior
CT report of 04/07/2013.
2. No gallstones.  No evidence of biliary disease.

## 2015-11-19 ENCOUNTER — Encounter (HOSPITAL_BASED_OUTPATIENT_CLINIC_OR_DEPARTMENT_OTHER): Payer: Self-pay

## 2015-11-19 ENCOUNTER — Emergency Department (HOSPITAL_BASED_OUTPATIENT_CLINIC_OR_DEPARTMENT_OTHER)
Admission: EM | Admit: 2015-11-19 | Discharge: 2015-11-20 | Disposition: A | Discharge status: Home / Self Care | Payer: BC Managed Care – PPO | Attending: Emergency Medicine | Admitting: Emergency Medicine

## 2015-11-19 DIAGNOSIS — R5383 Other fatigue: Secondary | ICD-10-CM | POA: Insufficient documentation

## 2015-11-19 DIAGNOSIS — R109 Unspecified abdominal pain: Secondary | ICD-10-CM | POA: Diagnosis not present

## 2015-11-19 DIAGNOSIS — R112 Nausea with vomiting, unspecified: Secondary | ICD-10-CM | POA: Diagnosis not present

## 2015-11-19 DIAGNOSIS — R110 Nausea: Secondary | ICD-10-CM

## 2015-11-19 DIAGNOSIS — R531 Weakness: Secondary | ICD-10-CM | POA: Insufficient documentation

## 2015-11-19 LAB — CBC WITH DIFFERENTIAL/PLATELET
Basophils Absolute: 0 10*3/uL (ref 0.0–0.1)
Basophils Relative: 0 %
Eosinophils Absolute: 0 10*3/uL (ref 0.0–0.7)
Eosinophils Relative: 0 %
HCT: 39 % (ref 36.0–46.0)
Hemoglobin: 14 g/dL (ref 12.0–15.0)
Lymphocytes Relative: 26 %
Lymphs Abs: 2.2 10*3/uL (ref 0.7–4.0)
MCH: 36.8 pg — ABNORMAL HIGH (ref 26.0–34.0)
MCHC: 35.9 g/dL (ref 30.0–36.0)
MCV: 102.6 fL — ABNORMAL HIGH (ref 78.0–100.0)
Monocytes Absolute: 0.8 10*3/uL (ref 0.1–1.0)
Monocytes Relative: 10 %
Neutro Abs: 5.4 10*3/uL (ref 1.7–7.7)
Neutrophils Relative %: 64 %
Platelets: 282 10*3/uL (ref 150–400)
RBC: 3.8 MIL/uL — ABNORMAL LOW (ref 3.87–5.11)
RDW: 11.8 % (ref 11.5–15.5)
WBC: 8.4 10*3/uL (ref 4.0–10.5)

## 2015-11-19 LAB — URINALYSIS, ROUTINE W REFLEX MICROSCOPIC
Bilirubin Urine: NEGATIVE
Glucose, UA: NEGATIVE mg/dL
Hgb urine dipstick: NEGATIVE
Ketones, ur: NEGATIVE mg/dL
Leukocytes, UA: NEGATIVE
NITRITE: NEGATIVE
PH: 5.5 (ref 5.0–8.0)
Protein, ur: NEGATIVE mg/dL
SPECIFIC GRAVITY, URINE: 1.009 (ref 1.005–1.030)

## 2015-11-19 LAB — PREGNANCY, URINE: Preg Test, Ur: NEGATIVE

## 2015-11-19 NOTE — ED Notes (Signed)
C/o fatigue, n/v x 6 months-pt NAD-entered triage eating chicken sandwich

## 2015-11-20 LAB — COMPREHENSIVE METABOLIC PANEL
ALBUMIN: 4.3 g/dL (ref 3.5–5.0)
ALT: 41 U/L (ref 14–54)
ANION GAP: 12 (ref 5–15)
AST: 107 U/L — ABNORMAL HIGH (ref 15–41)
Alkaline Phosphatase: 53 U/L (ref 38–126)
BUN: 7 mg/dL (ref 6–20)
CHLORIDE: 101 mmol/L (ref 101–111)
CO2: 23 mmol/L (ref 22–32)
Calcium: 8.9 mg/dL (ref 8.9–10.3)
Creatinine, Ser: 0.57 mg/dL (ref 0.44–1.00)
GFR calc non Af Amer: >60 mL/min (ref >60)
GLUCOSE: 110 mg/dL — AB (ref 65–99)
POTASSIUM: 3.7 mmol/L (ref 3.5–5.1)
SODIUM: 136 mmol/L (ref 135–145)
Total Bilirubin: 2.2 mg/dL — ABNORMAL HIGH (ref 0.3–1.2)
Total Protein: 7.7 g/dL (ref 6.5–8.1)

## 2015-11-20 MED ORDER — ONDANSETRON 4 MG PO TBDP: 4.0000 mg | ORAL_TABLET | Freq: Three times a day (TID) | ORAL | Status: DC | PRN

## 2015-11-20 NOTE — ED Provider Notes (Signed)
CSN: [id number]     Arrival date & time 11/19/15  2028 History   First MD Initiated Contact with Patient 11/19/15 2155     Chief Complaint  Patient presents with  . Fatigue     (Consider location/radiation/quality/duration/timing/severity/associated sxs/prior Treatment) HPI Comments: [name] is a 35 y.o. Female with history of depression, elevated LFTs and per pt liver cirrhosis, alcohol dependence, and anemia presents to ED with complaint of fatigue, N/V, and lightheadedness. Pt states her PCP sent her to ED for work up of her fatigue. Patient states symptoms have been on-going for the last 6 months. She has to "take a nap before driving home from work"  and she is too tired to "walk up stairs." States she sleeps constantly, but does not feel rested. She also states is constantly nauseous and vomiting, with a decrease in appetite. However, states she is able to eat and keep food down between the hours of 8-10pm. Today, while waiting to be seen she ate a chicken sandwich and has not vomited in the ED. She is able to drink and keep fluids down - hot tea eases her stomach. She endorses intermittent abdominal pain, but is unable to localize where the pain is located. She is currently not experiencing abdominal pain. She states that she has a burning sensation in her esophgaus, most likely related to her frequent vomiting. She experiences light headedness with positional change, she is currently not experiencing lightheadedness. Denies fever or chills, heat/cold intolerances, chest pain, shortness of breath, blood in stool, or headaches.   Of note, approximately 6 months ago she separated from her husband. She endorses feeling depressed and a loss of interest in activities. She denies suicidal or homicidal ideation. When asked about quantity of alcohol consumption, patient is rather evasive and states she drinks. States she had a glass of wine with lunch today.     The history is provided by the  patient and medical records.    Past Medical History  Diagnosis Date  . Depression    Past Surgical History  Procedure Laterality Date  . Tubal ligation     No family history on file. Social History  Substance Use Topics  . Smoking status: Never Smoker   . Smokeless tobacco: None  . Alcohol Use: Yes     Comment: weekly   OB History    No data available     Review of Systems  Constitutional: Positive for diaphoresis, appetite change ( decrease) and fatigue. Negative for fever and chills.  HENT: Negative for sore throat.   Eyes: Negative for visual disturbance.  Respiratory: Negative for shortness of breath.   Cardiovascular: Negative for chest pain and leg swelling.  Gastrointestinal: Positive for nausea, vomiting and abdominal pain ( intermittent, not currently experiencing). Negative for diarrhea, constipation and blood in stool.  Endocrine: Negative for cold intolerance and heat intolerance.  Genitourinary: Positive for menstrual problem ( irreular for 5-6 months). Negative for dysuria and hematuria.  Musculoskeletal: Negative for myalgias, arthralgias, neck pain and neck stiffness.  Skin: Negative for rash.  Neurological: Positive for weakness ( generalized), light-headedness ( with positional changes) and numbness ( chronic x 2 years in left foot). Negative for dizziness, facial asymmetry and headaches.  Hematological: Bruises/bleeds easily.  Psychiatric/Behavioral: Positive for sleep disturbance. Negative for suicidal ideas and self-injury.       Depression, anhedonia      Allergies  Iodine  Home Medications   Prior to Admission medications   Medication Sig  Start Date End Date Taking? Authorizing Provider  ondansetron (ZOFRAN ODT) 4 MG disintegrating tablet Take 1 tablet (4 mg total) by mouth every 8 (eight) hours as needed for nausea or vomiting. 11/20/15   Roxanna Mew, PA-C   BP 138/99 mmHg  Pulse 99  Temp(Src) 98.7 F (37.1 C) (Oral)  Resp 16  Ht  5\' 6"  (1.676 m)  Wt 73.936 kg  BMI 26.32 kg/m2  SpO2 97%  LMP  (LMP Unknown) Physical Exam  Constitutional: She appears well-developed and well-nourished. No distress.  HENT:  Head: Normocephalic and atraumatic.  Mouth/Throat: Uvula is midline, oropharynx is clear and moist and mucous membranes are normal. No trismus in the jaw. No uvula swelling. No oropharyngeal exudate, posterior oropharyngeal edema, posterior oropharyngeal erythema or tonsillar abscesses.  Eyes: Conjunctivae and EOM are normal. Pupils are equal, round, and reactive to light. Right eye exhibits no discharge. Left eye exhibits no discharge. No scleral icterus.  Neck: Normal range of motion and phonation normal. Neck supple. No tracheal tenderness, no spinous process tenderness and no muscular tenderness present. No rigidity. No tracheal deviation and normal range of motion present.  Cardiovascular: Normal rate, regular rhythm, normal heart sounds and intact distal pulses.   No murmur heard. Pulmonary/Chest: Effort normal and breath sounds normal. No stridor. No respiratory distress.  Abdominal: Soft. Bowel sounds are normal. She exhibits no pulsatile midline mass. There is no tenderness. There is no rebound and no guarding.  Musculoskeletal: Normal range of motion. She exhibits no edema or tenderness.  Lymphadenopathy:    She has no cervical adenopathy.  Neurological: She is alert. Coordination normal.  Mental Status:  Alert, oriented, thought content appropriate, able to give a coherent history. Speech fluent without evidence of aphasia. Able to follow 2 step commands without difficulty.  Cranial Nerves:  II:  Peripheral visual fields grossly normal, pupils equal, round, reactive to light III,IV, VI: ptosis not present, extra-ocular motions intact bilaterally  V,VII: smile symmetric, facial light touch sensation equal VIII: hearing grossly normal to voice  X: uvula elevates symmetrically  XI: bilateral shoulder shrug  symmetric and strong XII: midline tongue extension without fassiculations Motor:  Normal tone. 5/5 in upper and lower extremities bilaterally including strong and equal grip strength and dorsiflexion/plantar flexion Sensory: Pinprick and light touch normal in all extremities.  Cerebellar: normal finger-to-nose with bilateral upper extremities Gait: normal gait and balance CV: distal pulses palpable throughout  Patient denies lightheadedness upon lying to sitting to standing position.   Skin: Skin is warm and dry. She is not diaphoretic.  Psychiatric: She has a normal mood and affect. Her behavior is normal.    ED Course  Procedures (including critical care time) Labs Review Labs Reviewed  COMPREHENSIVE METABOLIC PANEL - Abnormal; Notable for the following:    Glucose, Bld 110 (*)    AST 107 (*)    Total Bilirubin 2.2 (*)    All other components within normal limits  CBC WITH DIFFERENTIAL/PLATELET - Abnormal; Notable for the following:    RBC 3.80 (*)    MCV 102.6 (*)    MCH 36.8 (*)    All other components within normal limits  URINALYSIS, ROUTINE W REFLEX MICROSCOPIC (NOT AT [facility])  PREGNANCY, URINE  TSH    Imaging Review No results found. I have personally reviewed and evaluated these images and lab results as part of my medical decision-making.   EKG Interpretation None      MDM   Final diagnoses:  Other fatigue  Nausea    Patient is afebrile and non-toxic. She appears sad and is occasionally tearful during H&P. Physical exam is unremarkable. Will check CMP, CBC, and U/A to evaluate for possible etiologies of fatigue.   U/A unremarkable. Pregnancy test negative. CBC unremarkable, doubt anemia as culprit of fatigue. AST and total bilirubin elevated; however, per pt history of elevated LFTs and states these are improved from previous labs. Review of records indicate labs are stable. Serum creatinine within normal limits, doubt kidney etiology. Cardiac exam within  normal limits, no lower extremity swelling, no JVD, doubt heart failure. Lungs clear to auscultation and O2 sats 100%, doubt pulmonary etiology. Neurologic exam unremarkable, doubt neurologic etiology. Following these labs wanted to add on TSH, patient politely declined and stated she would have her PCP draw TSH. Patient requesting ginger ale and ready to go home.   Fatigue may be secondary to depression vs. Thyroid dysfunction (pending labs) vs. Liver etioloy (chronically elevated LFTs, h/o alcohol abuse, possible fatty infiltration vs hepatocellular disease noted on previous imaging) vs possible alcohol abuse. Discussed lab results with patient. Provided Rx for Zofran for nausea. Encouraged follow up with PCP for further evaluation and TSH check. Discussed return precautions. Patient voiced understanding and is agreeable.    Roxanna Mew, [place] 11/20/15 2150  Leo Grosser, MD 11/21/15 [phone]

## 2015-11-20 NOTE — Discharge Instructions (Signed)
Read the information below.  Your WBC was normal. Your hemoglobin was normal. Your urine was normal. Your electrolytes were within normal limits. Your AST and total bilirubin were elevated, but improved from previous labs.  Use the prescribed medication as directed.  Please discuss all new medications with your pharmacist.  You are prescribed zofran for nausea. Take every 8hrs as needed for nausea.  For sleep, you can try OTC melatonin.  Be sure to call and schedule a follow up appointment with your PCP for further evaluation and work up of you fatigue. Be sure to have your thyroid hormone checked.  You may return to the Emergency Department at any time for worsening condition or any new symptoms that concern you. Return to the ED if you develop fever, chest pain, shortness of breath, or dizziness/loss of consciousness.     Fatigue Fatigue is feeling tired all of the time, a lack of energy, or a lack of motivation. Occasional or mild fatigue is often a normal response to activity or life in general. However, long-lasting (chronic) or extreme fatigue may indicate an underlying medical condition. HOME CARE INSTRUCTIONS  Watch your fatigue for any changes. The following actions may help to lessen any discomfort you are feeling:  Talk to your health care provider about how much sleep you need each night. Try to get the required amount every night.  Take medicines only as directed by your health care provider.  Eat a healthy and nutritious diet. Ask your health care provider if you need help changing your diet.  Drink enough fluid to keep your urine clear or pale yellow.  Practice ways of relaxing, such as yoga, meditation, massage therapy, or acupuncture.  Exercise regularly.   Change situations that cause you stress. Try to keep your work and personal routine reasonable.  Do not abuse illegal drugs.  Limit alcohol intake to no more than 1 drink per day for nonpregnant women and 2 drinks  per day for men. One drink equals 12 ounces of beer, 5 ounces of wine, or 1 ounces of hard liquor.  Take a multivitamin, if directed by your health care provider. SEEK MEDICAL CARE IF:   Your fatigue does not get better.  You have a fever.   You have unintentional weight loss or gain.  You have headaches.   You have difficulty:   Falling asleep.  Sleeping throughout the night.  You feel angry, guilty, anxious, or sad.   You are unable to have a bowel movement (constipation).   You skin is dry.   Your legs or another part of your body is swollen.  SEEK IMMEDIATE MEDICAL CARE IF:   You feel confused.   Your vision is blurry.  You feel faint or pass out.   You have a severe headache.   You have severe abdominal, pelvic, or back pain.   You have chest pain, shortness of breath, or an irregular or fast heartbeat.   You are unable to urinate or you urinate less than normal.   You develop abnormal bleeding, such as bleeding from the rectum, vagina, nose, lungs, or nipples.  You vomit blood.   You have thoughts about harming yourself or committing suicide.   You are worried that you might harm someone else.    This information is not intended to replace advice given to you by your health care provider. Make sure you discuss any questions you have with your health care provider.   Document Released: 04/09/2007 Document Revised:  07/03/2014 Document Reviewed: 10/14/2013 Elsevier Interactive Patient Education [employer].

## 2016-03-17 ENCOUNTER — Encounter: Payer: Self-pay | Admitting: *Deleted

## 2016-05-25 ENCOUNTER — Ambulatory Visit (INDEPENDENT_AMBULATORY_CARE_PROVIDER_SITE_OTHER): Payer: BC Managed Care – PPO | Admitting: Physician Assistant

## 2016-05-25 ENCOUNTER — Telehealth: Payer: Self-pay | Admitting: Family Medicine

## 2016-05-25 VITALS — BP 142/100 | HR 97 | Temp 98.6°F | Resp 17 | Ht 66.25 in | Wt 165.0 lb

## 2016-05-25 DIAGNOSIS — R739 Hyperglycemia, unspecified: Secondary | ICD-10-CM | POA: Diagnosis not present

## 2016-05-25 DIAGNOSIS — D510 Vitamin B12 deficiency anemia due to intrinsic factor deficiency: Secondary | ICD-10-CM

## 2016-05-25 DIAGNOSIS — R945 Abnormal results of liver function studies: Secondary | ICD-10-CM

## 2016-05-25 DIAGNOSIS — R7989 Other specified abnormal findings of blood chemistry: Secondary | ICD-10-CM

## 2016-05-25 DIAGNOSIS — R1110 Vomiting, unspecified: Secondary | ICD-10-CM | POA: Diagnosis not present

## 2016-05-25 DIAGNOSIS — R319 Hematuria, unspecified: Secondary | ICD-10-CM

## 2016-05-25 LAB — POCT CBC
Granulocyte percent: 65.2 %G (ref 37–80)
HCT, POC: 33.5 % — AB (ref 37.7–47.9)
HEMOGLOBIN: 11.8 g/dL — AB (ref 12.2–16.2)
LYMPH, POC: 2.1 (ref 0.6–3.4)
MCH, POC: 39.6 pg — AB (ref 27–31.2)
MCHC: 35.3 g/dL (ref 31.8–35.4)
MCV: 112.2 fL — AB (ref 80–97)
MID (cbc): 0.6 (ref 0–0.9)
MPV: 6.2 fL (ref 0–99.8)
PLATELET COUNT, POC: 294 10*3/uL (ref 142–424)
POC Granulocyte: 5 (ref 2–6.9)
POC LYMPH PERCENT: 27.3 %L (ref 10–50)
POC MID %: 7.5 %M (ref 0–12)
RBC: 2.99 M/uL — AB (ref 4.04–5.48)
RDW, POC: 18.3 %
WBC: 7.6 10*3/uL (ref 4.6–10.2)

## 2016-05-25 LAB — POC MICROSCOPIC URINALYSIS (UMFC): Mucus: ABSENT

## 2016-05-25 LAB — COMPREHENSIVE METABOLIC PANEL
ALK PHOS: 40 U/L (ref 33–115)
ALT: 15 U/L (ref 6–29)
AST: 42 U/L — AB (ref 10–30)
Albumin: 4.4 g/dL (ref 3.6–5.1)
BILIRUBIN TOTAL: 1.7 mg/dL — AB (ref 0.2–1.2)
BUN: 6 mg/dL — AB (ref 7–25)
CALCIUM: 8.6 mg/dL (ref 8.6–10.2)
CO2: 28 mmol/L (ref 20–31)
Chloride: 96 mmol/L — ABNORMAL LOW (ref 98–110)
Creat: 0.6 mg/dL (ref 0.50–1.10)
Glucose, Bld: 130 mg/dL — ABNORMAL HIGH (ref 65–99)
POTASSIUM: 3.2 mmol/L — AB (ref 3.5–5.3)
Sodium: 138 mmol/L (ref 135–146)
TOTAL PROTEIN: 7.1 g/dL (ref 6.1–8.1)

## 2016-05-25 LAB — POCT URINALYSIS DIP (MANUAL ENTRY)
Bilirubin, UA: NEGATIVE
Glucose, UA: NEGATIVE
Ketones, POC UA: NEGATIVE
Leukocytes, UA: NEGATIVE
NITRITE UA: POSITIVE — AB
PH UA: 5.5
Protein Ur, POC: 30 — AB
Spec Grav, UA: >=1.03
UROBILINOGEN UA: 0.2

## 2016-05-25 LAB — GLUCOSE, POCT (MANUAL RESULT ENTRY): POC GLUCOSE: 114 mg/dL — AB (ref 70–99)

## 2016-05-25 LAB — POCT URINE PREGNANCY: PREG TEST UR: NEGATIVE

## 2016-05-25 MED ORDER — SUCRALFATE 1 G PO TABS: 1.0000 g | ORAL_TABLET | Freq: Three times a day (TID) | ORAL | 0 refills | Status: DC

## 2016-05-25 NOTE — Telephone Encounter (Signed)
I did send the carafate. I was not aware that she was expecting another medication. She can purchase OTC omeprazole without a prescription. The patient left before the encounter was complete. When I returned to review the results of the in office labs, she was gone. I will contact her with the results once everything is back.

## 2016-05-25 NOTE — Progress Notes (Signed)
Subjective:    Patient ID: [name], female    DOB: 11-02-1980, 35 y.o.   MRN: [id number]  Chief Complaint  Patient presents with  . Fatigue  . Emesis    Off and on onset 9 months, getting worse  . Eating makes pt sick  . Gastroesophageal Reflux   HPI: Presents requesting H. Pylori breath test. Patient states she was referred here by her father (whom is an OB/GYN in [place]) for a H. Pylori breath test due to her increasing reflux symptoms over the past 9 months.   Patient states her throat "feels so raw" that it causes her cough and then she begins coughing so much that she starts vomiting. Describes her vomiting as yellow-orange in color. Notes 15+ episodes of vomiting a day and states she is unable to eat anything because of this. Has been increasingly worse over the past month, stating she has only been eating occasional nuts, crackers, and bread. She is hesitant to eat because it induces the symptoms. Endorses weight loss of 11 lbs in the past 3 weeks. She cut acidic drinks and dairy out of her diet which she read online could provide relief but it has not done so. She gets mid sternal chest pain with radiation to the back with these episodes. States sometimes the coughing or burning pain will "take her breath away". Also has associated generalized abdominal pain with this episodes, unable to specify a location. Associated diarrhea, notes 10-15 episodes daily over the past month as well. Denies melena, hematochezia. Endorses increased belching, abdominal bloating, dysphagia, rhinorrhea, and fatigue. States she feels like she needs to constantly have her throat "lubricated" with water or lozenges to prevent the raw feeling from inducing the cough and entire cycle.  Denies fevers, chills, hemoptysis, cigarette smoking, heavy alcohol use, or NSAIDs.  States the symptoms worsen when she is lying down and sometimes it will keep her up for hours during the night. Has used Prilosec and a  PPI (unsure of which one) which were recommended by her father but they have not provided much relief.   Endorses occasional eye itching, occasional blurry vision, and yellowing/discoloration of her eyes the past few months as well. States the blurry vision can her entire drive home at times and states there have been times where she has to pull over because it has been so bad over the past few months. Unable to elicit full history on these symptoms as patient often answered she was "not sure". Denies syncope, "floaters" in visual field, photophobia, decreased motor or sensory function. She endorses some lightheadedness and near-syncope episodes as well. States she occasionally feels uneasy on her feet in the mornings or during the day. These symptoms were elicited during ROS but patient wanted to focus on symptoms above today.  After interview, patient stated she needed to just get the H. Pylori test and return to work, was not interested in full evaluation, physical exam. Upon return to room, patient states "she wantsto get everything done" so full physical exam and testing was initiated.   Review of Systems  Constitutional: Positive for appetite change. Negative for chills, fatigue and fever.  HENT: Positive for rhinorrhea, sore throat and trouble swallowing. Negative for congestion, ear discharge, ear pain, postnasal drip, sinus pain and sinus pressure.   Eyes: Positive for itching and visual disturbance. Negative for pain, discharge and redness.  Respiratory: Positive for cough, chest tightness and shortness of breath. Negative for wheezing.   Cardiovascular: Negative  for chest pain and palpitations.  Gastrointestinal: Positive for abdominal distention, abdominal pain, diarrhea, nausea and vomiting. Negative for blood in stool and constipation.  Genitourinary: Negative for difficulty urinating, dysuria, frequency, hematuria and urgency.  Neurological: Positive for light-headedness. Negative for  dizziness, syncope, weakness and headaches.  Psychiatric/Behavioral: Negative for dysphoric mood.   Allergies  Allergen Reactions  . Iodine    Patient Active Problem List   Diagnosis Date Noted  . Alcohol dependence ([place]) 11/27/2014  . Elevated liver function tests 11/27/2014  . Pernicious anemia 04/02/2013   Prior to Admission medications   Not on File       Objective: Blood pressure (!) 142/100, pulse 97, temperature 98.6 F (37 C), temperature source Oral, resp. rate 17, height 5' 6.25" (1.683 m), weight 165 lb (74.8 kg), last menstrual period 05/22/2016, SpO2 98 %.   Physical Exam  Constitutional: She is oriented to person, place, and time. She appears well-developed and well-nourished. No distress.  HENT:  Head: Normocephalic and atraumatic.  Mouth/Throat: Uvula is midline and mucous membranes are normal. Mucous membranes are not pale, not dry and not cyanotic. No oral lesions. Normal dentition. No uvula swelling or lacerations. No oropharyngeal exudate, posterior oropharyngeal edema, posterior oropharyngeal erythema or tonsillar abscesses.  Eyes: Conjunctivae and EOM are normal. Pupils are equal, round, and reactive to light. Right eye exhibits no discharge and no exudate. Left eye exhibits no discharge and no exudate. Right conjunctiva is not injected. Right conjunctiva has no hemorrhage. Left conjunctiva is not injected. Left conjunctiva has no hemorrhage. No scleral icterus. Right eye exhibits normal extraocular motion and no nystagmus. Left eye exhibits normal extraocular motion and no nystagmus. Right pupil is round and reactive. Left pupil is round and reactive. Pupils are equal.  Neck: Normal range of motion and full passive range of motion without pain. Neck supple. No tracheal deviation, no edema and normal range of motion present. No thyromegaly present.  Cardiovascular: Normal rate, regular rhythm, S1 normal, S2 normal, normal heart sounds and intact distal pulses.  Exam  reveals no gallop, no distant heart sounds and no friction rub.   No murmur heard. Pulses:      Radial pulses are 2+ on the right side, and 2+ on the left side.       Popliteal pulses are 2+ on the right side, and 2+ on the left side.       Dorsalis pedis pulses are 2+ on the right side, and 2+ on the left side.       Posterior tibial pulses are 2+ on the right side, and 2+ on the left side.  Pulmonary/Chest: Effort normal and breath sounds normal. No accessory muscle usage or stridor. No tachypnea and no bradypnea. No respiratory distress. She has no decreased breath sounds. She has no wheezes. She has no rhonchi. She has no rales. She exhibits no tenderness.  Abdominal: Normal appearance and bowel sounds are normal. She exhibits no distension, no ascites and no pulsatile midline mass. There is tenderness in the right upper quadrant, epigastric area and left lower quadrant. There is guarding and positive Murphy's sign. There is no rigidity, no rebound and no tenderness at McBurney's point.  Lymphadenopathy:       Head (right side): No submental, no submandibular, no tonsillar, no preauricular and no posterior auricular adenopathy present.       Head (left side): No submental, no submandibular, no tonsillar, no preauricular and no posterior auricular adenopathy present.    She has  no cervical adenopathy.  Neurological: She is alert and oriented to person, place, and time. She has normal strength. She displays no atrophy and no tremor. No cranial nerve deficit or sensory deficit. She exhibits normal muscle tone. She displays a negative Romberg sign. Coordination and gait normal. GCS eye subscore is 4. GCS verbal subscore is 5. GCS motor subscore is 6.  Reflex Scores:      Brachioradialis reflexes are 2+ on the right side and 2+ on the left side.      Patellar reflexes are 2+ on the right side and 2+ on the left side.      Achilles reflexes are 2+ on the right side and 2+ on the left  side. Finger-to-nose, heel-to-shin, and rapid-alternating movement tests performed appropriately.  Skin: Skin is warm and dry. No rash noted. She is not diaphoretic. No erythema.  Psychiatric: She has a normal mood and affect. Her behavior is normal.      Results for orders placed or performed in visit on 05/25/16  POCT CBC  Result Value Ref Range   WBC 7.6 4.6 - 10.2 K/uL   Lymph, poc 2.1 0.6 - 3.4   POC LYMPH PERCENT 27.3 10 - 50 %L   MID (cbc) 0.6 0 - 0.9   POC MID % 7.5 0 - 12 %M   POC Granulocyte 5.0 2 - 6.9   Granulocyte percent 65.2 37 - 80 %G   RBC 2.99 (A) 4.04 - 5.48 M/uL   Hemoglobin 11.8 (A) 12.2 - 16.2 g/dL   HCT, POC 33.5 (A) 37.7 - 47.9 %   MCV 112.2 (A) 80 - 97 fL   MCH, POC 39.6 (A) 27 - 31.2 pg   MCHC 35.3 31.8 - 35.4 g/dL   RDW, POC 18.3 %   Platelet Count, POC 294 142 - 424 K/uL   MPV 6.2 0 - 99.8 fL  POCT glucose (manual entry)  Result Value Ref Range   POC Glucose 114 (A) 70 - 99 mg/dl  POCT urinalysis dipstick  Result Value Ref Range   Color, UA yellow yellow   Clarity, UA clear clear   Glucose, UA negative negative   Bilirubin, UA negative negative   Ketones, POC UA negative negative   Spec Grav, UA >=1.030    Blood, UA large (A) negative   pH, UA 5.5    Protein Ur, POC =30 (A) negative   Urobilinogen, UA 0.2    Nitrite, UA Positive (A) Negative   Leukocytes, UA Negative Negative  POCT Microscopic Urinalysis ([facility])  Result Value Ref Range   WBC,UR,HPF,POC Few (A) None WBC/hpf   RBC,UR,HPF,POC Few (A) None RBC/hpf   Bacteria None None, Too numerous to count   Mucus Absent Absent   Epithelial Cells, UR Per Microscopy Few (A) None, Too numerous to count cells/hpf  POCT urine pregnancy  Result Value Ref Range   Preg Test, Ur Negative Negative       Assessment & Plan:  1. Vomiting, intractability of vomiting not specified, presence of nausea not specified, unspecified vomiting type UA with blood, protein, nitrites, but some epithelial  cells. Patient without urinary complaints, no tx at this time but will update regimen upon review of urine culture and change plan as needed. Urine pregnancy negative. POCT glucose elevated at 114 mg/dL, HgbA1C pending. CMP, H. Pylori breath test, TSH/HIV (screenings) pending. Referral for RUQ U/S provided. Advised patient to continue Prilosec and PPI at home for symptom relief and added rx of Carafate. Upon  review of all results, will update patient and decide upon plan. Referral to GI for GERD and/or gallbladder will likely be needed. - H. pylori breath test - POCT glucose (manual entry) - POCT urinalysis dipstick - POCT Microscopic Urinalysis ([facility]) - POCT urine pregnancy - Comprehensive metabolic panel - HIV antibody - TSH - US Abdomen Limited RUQ; Future - sucralfate (CARAFATE) 1 g tablet; Take 1 tablet (1 g total) by mouth 4 (four) times daily -  with meals and at bedtime.  Dispense: 120 tablet; Refill: 0  2. Pernicious anemia CBC with decreased RBCs 2.99, Hgb 11.8 mg/dL, HCT 33.5, MCV 112.2, MCH 39.6. Vitamin B12 pending.  - POCT CBC - Vitamin B12  3. Elevated liver function tests CMP pending. RUQ U/S ordered, patient will be contacted to schedule. See above.  4. Hematuria, unspecified type UA with large blood but patient recently on menstrual period. Will review urine culture and update plan upon review of results. - Urine culture  5. Hyperglycemia POCT glucose of 114 mg/dL non-fasting. HgA1C pending, will update patient and management plan upon review of results. - Hemoglobin A1c  ***Of note: patient left without telling staff before results and plan were able to be discussed. Patient called with discharge instructed by TL and AVS to be mailed.

## 2016-05-25 NOTE — Progress Notes (Signed)
Patient ID: [name], female     DOB: 12-08-1980, 35 y.o.    MRN: [id number]  PCP: [name], FNP  Chief Complaint  Patient presents with  . Fatigue  . Emesis    Off and on onset 9 months, getting worse  . Eating makes pt sick  . Gastroesophageal Reflux    Subjective:   This patient is new to this practice and presents requesting H pylori testing.  Due to increasing reflux symptoms over the past 9 months, her father, an OBGYN physician, recommended H pylori testing.  "raw" throat, which causes cough. Coughing so hard she vomits. Emesis 10-15 times/day. Associated with burning chest pain into the back. "Takes [her] breath away." Generalized abdominal pain, "I can't tell you where it is unless it's happening." Diarrhea, 10-15 episodes/day x 4 weeks. Unable to eat due to coughing and vomiting. Reports 11 lb weight loss in past 3 weeks. Has eliminated acidic and dairy from her diet without improvement. Her father recommended either an H2 blocker or PPI, she doesn't recall which, but without improvement. Worse with lying down, and can keep her from sleeping.  Occasional eye itching, blurry vision and yellow discoloration of the eyes. Sometimes the blurry vision is brief, but sometimes lasts long enough that it significantly impairs her. She has had to pull over while driving. Details of these symptoms are difficult to elicit. She answers that she is "not sure."  No melena, hematochezia. No syncope, but some near syncope. No double vision, visual field loss. No weakness, falls, numbness or tingling.   When the provider initially entered the room, she related multiple other complaints, but when exam started, she stated that she only wanted the test requested initially. When advised that some of her symptoms could represent serious health concerns, she stated "do everything."  History of pernicious anemia, elevated LFTs and alcohol abuse are noted in the record,  but not revealed by the patient.    Review of Systems Constitutional: Positive for appetite change. Negative for chills, fatigue and fever.  HENT: Positive for rhinorrhea, sore throat and trouble swallowing. Negative for congestion, ear discharge, ear pain, postnasal drip, sinus pain and sinus pressure.   Eyes: Positive for itching and visual disturbance. Negative for pain, discharge and redness.  Respiratory: Positive for cough, chest tightness and shortness of breath. Negative for wheezing.   Cardiovascular: Negative for chest pain and palpitations.  Gastrointestinal: Positive for abdominal distention, abdominal pain, diarrhea, nausea and vomiting. Negative for blood in stool and constipation.  Genitourinary: Negative for difficulty urinating, dysuria, frequency, hematuria and urgency.  Neurological: Positive for light-headedness. Negative for dizziness, syncope, weakness and headaches.  Psychiatric/Behavioral: Negative for dysphoric mood.   Prior to Admission medications   Not on File     Allergies  Allergen Reactions  . Iodine      Patient Active Problem List   Diagnosis Date Noted  . Alcohol dependence ([place]) 11/27/2014  . Elevated liver function tests 11/27/2014  . Pernicious anemia 04/02/2013     Family History  Problem Relation Age of Onset  . Cancer Mother     Leukemia  . Diabetes Father   . Hyperlipidemia Father   . Hypertension Father   . Prostate cancer Father   . Bell's palsy Father      Social History   Social History  . Marital status: Married    Spouse name: N/A  . Number of children: N/A  . Years of education: N/A  Occupational History  . Not on file.   Social History Main Topics  . Smoking status: Former Smoker    Packs/day: 0.25    Years: 4.00    Types: Cigarettes    Quit date: 05/26/2007  . Smokeless tobacco: Never Used  . Alcohol use 1.8 oz/week    3 Standard drinks or equivalent per week     Comment: weekly  . Drug use: No  .  Sexual activity: Yes    Birth control/ protection: Surgical   Other Topics Concern  . Not on file   Social History Narrative  . No narrative on file         Objective:  Physical Exam  Constitutional: She is oriented to person, place, and time. She appears well-developed and well-nourished. She is active and cooperative. No distress.  BP (!) 142/100 (BP Location: Right Arm, Patient Position: Sitting, Cuff Size: Normal)   Pulse 97   Temp 98.6 F (37 C) (Oral)   Resp 17   Ht 5' 6.25" (1.683 m)   Wt 165 lb (74.8 kg)   LMP 05/22/2016   SpO2 98%   BMI 26.43 kg/m   HENT:  Head: Normocephalic and atraumatic.  Right Ear: Hearing normal.  Left Ear: Hearing normal.  Eyes: Conjunctivae and EOM are normal. No scleral icterus.  Neck: Normal range of motion, full passive range of motion without pain and phonation normal. Neck supple. No thyromegaly present.  Cardiovascular: Normal rate, regular rhythm and normal heart sounds.   Pulses:      Radial pulses are 2+ on the right side, and 2+ on the left side.  Pulmonary/Chest: Effort normal and breath sounds normal.  Abdominal: Normal appearance and bowel sounds are normal. There is no hepatosplenomegaly. There is generalized tenderness. There is guarding (epigastric) and positive Murphy's sign. There is no CVA tenderness.  Lymphadenopathy:       Head (right side): No tonsillar, no preauricular, no posterior auricular and no occipital adenopathy present.       Head (left side): No tonsillar, no preauricular, no posterior auricular and no occipital adenopathy present.    She has no cervical adenopathy.       Right: No supraclavicular adenopathy present.       Left: No supraclavicular adenopathy present.  Neurological: She is alert and oriented to person, place, and time. She has normal strength. No sensory deficit.  Skin: Skin is warm, dry and intact. No rash noted. No cyanosis or erythema. Nails show no clubbing.  Psychiatric: She has a  normal mood and affect. Her speech is normal and behavior is normal.    Results for orders placed or performed in visit on 05/25/16  POCT CBC  Result Value Ref Range   WBC 7.6 4.6 - 10.2 K/uL   Lymph, poc 2.1 0.6 - 3.4   POC LYMPH PERCENT 27.3 10 - 50 %L   MID (cbc) 0.6 0 - 0.9   POC MID % 7.5 0 - 12 %M   POC Granulocyte 5.0 2 - 6.9   Granulocyte percent 65.2 37 - 80 %G   RBC 2.99 (A) 4.04 - 5.48 M/uL   Hemoglobin 11.8 (A) 12.2 - 16.2 g/dL   HCT, POC 33.5 (A) 37.7 - 47.9 %   MCV 112.2 (A) 80 - 97 fL   MCH, POC 39.6 (A) 27 - 31.2 pg   MCHC 35.3 31.8 - 35.4 g/dL   RDW, POC 18.3 %   Platelet Count, POC 294 142 - 424 K/uL  MPV 6.2 0 - 99.8 fL  POCT glucose (manual entry)  Result Value Ref Range   POC Glucose 114 (A) 70 - 99 mg/dl  POCT urinalysis dipstick  Result Value Ref Range   Color, UA yellow yellow   Clarity, UA clear clear   Glucose, UA negative negative   Bilirubin, UA negative negative   Ketones, POC UA negative negative   Spec Grav, UA >=1.030    Blood, UA large (A) negative   pH, UA 5.5    Protein Ur, POC =30 (A) negative   Urobilinogen, UA 0.2    Nitrite, UA Positive (A) Negative   Leukocytes, UA Negative Negative  POCT urine pregnancy  Result Value Ref Range   Preg Test, Ur Negative Negative            Assessment & Plan:  1. Vomiting, intractability of vomiting not specified, presence of nausea not specified, unspecified vomiting type Uncertain etiology. Await lab results. RUQ [place]. Likely needs evaluation with GI.  - H. pylori breath test - POCT glucose (manual entry) - POCT urinalysis dipstick - POCT Microscopic Urinalysis ([facility]) - POCT urine pregnancy - Comprehensive metabolic panel - HIV antibody - TSH - US Abdomen Limited RUQ; Future - sucralfate (CARAFATE) 1 g tablet; Take 1 tablet (1 g total) by mouth 4 (four) times daily -  with meals and at bedtime.  Dispense: 120 tablet; Refill: 0  2. Pernicious anemia - POCT CBC - Vitamin B12  3.  Elevated liver function tests See above.  4. Hematuria, unspecified type Asymptomatic. Await remaining lab results. Possibly due to recent menstrual bleed. - Urine culture  5. Hyperglycemia - Hemoglobin A1c  When provider returned to the exam room to relate the results of the POCTs, the patient had left, without communicating with any of the staff. She will be contacted by phone.   Fara Chute, PA-C Physician Assistant-Certified [facility]

## 2016-05-25 NOTE — Telephone Encounter (Signed)
Pt calling stating that carafate need to be sent to the [facility] on [address] in [place] and that chelle was supposed to sent RX for an inhibitor please respond

## 2016-05-25 NOTE — Patient Instructions (Addendum)
Please continue the medication your father recommended (you can get more over the counter).  Once we review the remaining lab results, we will make a more definitive plan. I suspect that you will need to see a gastroenterologist for additional evaluation, but I hope that we can help you feel better in the interim.  The imaging facility will contact you directly to schedule the ultrasound.   IF you received an x-ray today, you will receive an invoice from [facility]. Please contact [facility] at [phone] with questions or concerns regarding your invoice.   IF you received labwork today, you will receive an invoice from [employer]. Please contact [facility] at [phone] with questions or concerns regarding your invoice.   Our billing staff will not be able to assist you with questions regarding bills from these companies.  You will be contacted with the lab results as soon as they are available. The fastest way to get your results is to activate your My Chart account. Instructions are located on the last page of this paperwork. If you have not heard from [place] regarding the results in 2 weeks, please contact this office.

## 2016-05-26 ENCOUNTER — Telehealth: Payer: Self-pay

## 2016-05-26 LAB — VITAMIN B12: Vitamin B-12: 328 pg/mL (ref 200–1100)

## 2016-05-26 LAB — HIV ANTIBODY (ROUTINE TESTING W REFLEX): HIV: NONREACTIVE

## 2016-05-26 LAB — TSH: TSH: 2.46 mIU/L

## 2016-05-26 LAB — HEMOGLOBIN A1C
Hgb A1c MFr Bld: 4.9 % (ref <5.7)
MEAN PLASMA GLUCOSE: 94 mg/dL

## 2016-05-26 LAB — H. PYLORI BREATH TEST: H. pylori Breath Test: NOT DETECTED

## 2016-05-26 NOTE — Telephone Encounter (Signed)
Please advise on lab results. Pt called to check on them.

## 2016-05-26 NOTE — Telephone Encounter (Signed)
Patient is calling to back to see if her labs have been reviewed

## 2016-05-26 NOTE — Telephone Encounter (Signed)
Patient is calling because she would like to know her lab results today if possible. She states that she is very anxious   [phone]

## 2016-05-27 LAB — URINE CULTURE

## 2016-05-27 NOTE — Telephone Encounter (Signed)
Per note from [facility], patient left before her UA was reviewed it shows, UA with blood, protein, nitrites, but some epithelial cells. Patient without urinary complaints, no tx at this time but will update regimen upon review of urine culture and change plan as needed. Urine pregnancy negative. POCT glucose elevated at 114 mg/dL, HgbA1C pending. CMP, H. Pylori breath test, TSH/HIV (screenings) pending.   Advised her labs pending and Chelle will review. I did advise her will know more when the Culture report is received/ reviewed. Told her the UA results.

## 2016-05-29 ENCOUNTER — Encounter: Payer: Self-pay | Admitting: Physician Assistant

## 2016-05-29 ENCOUNTER — Telehealth: Payer: Self-pay

## 2016-05-29 NOTE — Telephone Encounter (Signed)
Pt requesting clinical to call her and answer questions Regarding her lab results    Best phone for pt is [phone]

## 2016-05-29 NOTE — Addendum Note (Signed)
Addended by: Fara Chute on: 05/29/2016 03:27 PM   Modules accepted: Orders

## 2016-05-30 ENCOUNTER — Telehealth: Payer: Self-pay

## 2016-05-30 NOTE — Telephone Encounter (Signed)
Pt calling about wanting lab results   Please advise

## 2016-05-30 NOTE — Telephone Encounter (Signed)
Pt was calling in regards to her lab results.  Please call [phone]

## 2016-05-30 NOTE — Telephone Encounter (Signed)
Pt father Dr. Bobby Rumpf is wanting to talk with the provider that saw pt to discuss the office visit but no hippa on file    Best number [phone]

## 2016-06-01 NOTE — Telephone Encounter (Signed)
See lab note, given results 05/30/16

## 2016-06-01 NOTE — Telephone Encounter (Signed)
See lab note.  

## 2016-06-08 ENCOUNTER — Ambulatory Visit: Payer: No Typology Code available for payment source | Admitting: Physician Assistant

## 2016-06-21 ENCOUNTER — Other Ambulatory Visit: Payer: Self-pay | Admitting: Physician Assistant

## 2016-06-21 DIAGNOSIS — R1110 Vomiting, unspecified: Secondary | ICD-10-CM

## 2016-06-30 ENCOUNTER — Ambulatory Visit: Payer: No Typology Code available for payment source | Admitting: Physician Assistant

## 2016-08-31 ENCOUNTER — Emergency Department (HOSPITAL_COMMUNITY)
Admission: EM | Admit: 2016-08-31 | Discharge: 2016-08-31 | Disposition: A | Discharge status: Home / Self Care | Payer: BC Managed Care – PPO | Attending: Emergency Medicine | Admitting: Emergency Medicine

## 2016-08-31 ENCOUNTER — Encounter (HOSPITAL_COMMUNITY): Payer: Self-pay

## 2016-08-31 DIAGNOSIS — R030 Elevated blood-pressure reading, without diagnosis of hypertension: Secondary | ICD-10-CM | POA: Insufficient documentation

## 2016-08-31 DIAGNOSIS — E876 Hypokalemia: Secondary | ICD-10-CM | POA: Insufficient documentation

## 2016-08-31 DIAGNOSIS — Z87891 Personal history of nicotine dependence: Secondary | ICD-10-CM | POA: Diagnosis not present

## 2016-08-31 DIAGNOSIS — K852 Alcohol induced acute pancreatitis without necrosis or infection: Secondary | ICD-10-CM | POA: Diagnosis not present

## 2016-08-31 DIAGNOSIS — R1110 Vomiting, unspecified: Secondary | ICD-10-CM | POA: Diagnosis present

## 2016-08-31 LAB — LIPASE, BLOOD: Lipase: 137 U/L — ABNORMAL HIGH (ref 11–51)

## 2016-08-31 LAB — URINALYSIS, ROUTINE W REFLEX MICROSCOPIC
Bilirubin Urine: NEGATIVE
Glucose, UA: NEGATIVE mg/dL
Hgb urine dipstick: NEGATIVE
Ketones, ur: 80 mg/dL — AB
Nitrite: NEGATIVE
PH: 5 (ref 5.0–8.0)
Protein, ur: 100 mg/dL — AB
SPECIFIC GRAVITY, URINE: 1.019 (ref 1.005–1.030)

## 2016-08-31 LAB — CBC
HCT: 36.5 % (ref 36.0–46.0)
Hemoglobin: 12.6 g/dL (ref 12.0–15.0)
MCH: 39.3 pg — AB (ref 26.0–34.0)
MCHC: 34.5 g/dL (ref 30.0–36.0)
MCV: 113.7 fL — ABNORMAL HIGH (ref 78.0–100.0)
Platelets: 222 10*3/uL (ref 150–400)
RBC: 3.21 MIL/uL — ABNORMAL LOW (ref 3.87–5.11)
RDW: 14.5 % (ref 11.5–15.5)
WBC: 6.7 10*3/uL (ref 4.0–10.5)

## 2016-08-31 LAB — COMPREHENSIVE METABOLIC PANEL
ALT: 42 U/L (ref 14–54)
AST: 167 U/L — AB (ref 15–41)
Albumin: 4.3 g/dL (ref 3.5–5.0)
Alkaline Phosphatase: 55 U/L (ref 38–126)
Anion gap: 16 — ABNORMAL HIGH (ref 5–15)
CHLORIDE: 94 mmol/L — AB (ref 101–111)
CO2: 26 mmol/L (ref 22–32)
CREATININE: 0.57 mg/dL (ref 0.44–1.00)
Calcium: 8.6 mg/dL — ABNORMAL LOW (ref 8.9–10.3)
GFR calc Af Amer: >60 mL/min (ref >60)
Glucose, Bld: 102 mg/dL — ABNORMAL HIGH (ref 65–99)
Potassium: 2.5 mmol/L — CL (ref 3.5–5.1)
SODIUM: 136 mmol/L (ref 135–145)
Total Bilirubin: 1.3 mg/dL — ABNORMAL HIGH (ref 0.3–1.2)
Total Protein: 7.9 g/dL (ref 6.5–8.1)

## 2016-08-31 LAB — I-STAT CHEM 8, ED
BUN: <3 mg/dL — ABNORMAL LOW (ref 6–20)
CALCIUM ION: 0.97 mmol/L — AB (ref 1.15–1.40)
Chloride: 98 mmol/L — ABNORMAL LOW (ref 101–111)
Creatinine, Ser: 0.4 mg/dL — ABNORMAL LOW (ref 0.44–1.00)
Glucose, Bld: 135 mg/dL — ABNORMAL HIGH (ref 65–99)
HCT: 34 % — ABNORMAL LOW (ref 36.0–46.0)
Hemoglobin: 11.6 g/dL — ABNORMAL LOW (ref 12.0–15.0)
POTASSIUM: 2.8 mmol/L — AB (ref 3.5–5.1)
SODIUM: 138 mmol/L (ref 135–145)
TCO2: 29 mmol/L (ref 0–100)

## 2016-08-31 LAB — PREGNANCY, URINE: PREG TEST UR: NEGATIVE

## 2016-08-31 LAB — MAGNESIUM: Magnesium: 1.2 mg/dL — ABNORMAL LOW (ref 1.7–2.4)

## 2016-08-31 MED ORDER — ONDANSETRON 4 MG PO TBDP
ORAL_TABLET | ORAL | Status: AC
  Filled 2016-08-31: qty 1

## 2016-08-31 MED ORDER — ONDANSETRON 4 MG PO TBDP
4.0000 mg | ORAL_TABLET | Freq: Once | ORAL | Status: AC | PRN
  Administered 2016-08-31: 4 mg via ORAL

## 2016-08-31 MED ORDER — METOCLOPRAMIDE HCL 10 MG PO TABS: 10.0000 mg | ORAL_TABLET | Freq: Four times a day (QID) | ORAL | 0 refills | Status: DC | PRN

## 2016-08-31 MED ORDER — METOCLOPRAMIDE HCL 5 MG/ML IJ SOLN
10.0000 mg | Freq: Once | INTRAMUSCULAR | Status: AC
  Administered 2016-08-31: 10 mg via INTRAVENOUS
  Filled 2016-08-31: qty 2

## 2016-08-31 MED ORDER — SODIUM CHLORIDE 0.9 % IV BOLUS (SEPSIS)
2000.0000 mL | Freq: Once | INTRAVENOUS | Status: AC
  Administered 2016-08-31: 2000 mL via INTRAVENOUS

## 2016-08-31 MED ORDER — POTASSIUM CHLORIDE CRYS ER 20 MEQ PO TBCR
60.0000 meq | EXTENDED_RELEASE_TABLET | Freq: Once | ORAL | Status: AC
  Administered 2016-08-31: 60 meq via ORAL
  Filled 2016-08-31: qty 3

## 2016-08-31 MED ORDER — MAGNESIUM SULFATE 2 GM/50ML IV SOLN: 2.0000 g | Freq: Once | INTRAVENOUS | Status: DC

## 2016-08-31 MED ORDER — MAGNESIUM OXIDE 400 (241.3 MG) MG PO TABS
800.0000 mg | ORAL_TABLET | Freq: Once | ORAL | Status: AC
  Administered 2016-08-31: 800 mg via ORAL
  Filled 2016-08-31: qty 2

## 2016-08-31 MED ORDER — POTASSIUM CHLORIDE CRYS ER 20 MEQ PO TBCR: 40.0000 meq | EXTENDED_RELEASE_TABLET | Freq: Every day | ORAL | 0 refills | Status: DC

## 2016-08-31 NOTE — ED Notes (Signed)
Pt states  She had appointment for endoscopy but doesn't like hospitals and cancelled the appointment, pt states that she has had these stomach issues for a year and she doesn't like to take meds

## 2016-08-31 NOTE — Discharge Instructions (Signed)
Your blood potassium was 2.5 today. Blood magnesium was 1.2. Both of which were low and likely as a result of vomiting. Take magnesium oxide tablets which are over-the-counter . Take one 400 mg tablet twice daily for the next 10 days. Take the potassium supplement as prescribed and nausea medicine as needed. Your blood pressure today was 140/100. It should be rechecked by your primary care physician within the next one or 2 weeks. Ask your primary care physician to help you to stop drinking alcohol. Call the resource guide to get help to stop drinking alcohol
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
## 2016-08-31 NOTE — ED Notes (Signed)
Dr j aware of k 2.5

## 2016-08-31 NOTE — ED Notes (Signed)
Pt felt nauseated before getting her potassium pills she was medicated after dr notified

## 2016-08-31 NOTE — ED Notes (Signed)
Spoke to patient, made her aware of paperwork availability.  Will return this evening.

## 2016-08-31 NOTE — ED Triage Notes (Signed)
Per Pt, Pt is coming from home with complaints of vomiting for multiple months. Reports blurred vision that started in the last four days, and feeling like her heart is beating quickly. Pt denies any pain. Reports some dizziness.

## 2016-08-31 NOTE — ED Notes (Signed)
Walked patient to the bathroom patient is back in bed resting

## 2016-08-31 NOTE — ED Notes (Signed)
Patient left without d/c paperwork.  States she had to pick up her children.  Patient unable to wait.  Will call patient and inform them of prescriptions and paperwork will be available for pickup.

## 2017-05-02 ENCOUNTER — Other Ambulatory Visit: Payer: Self-pay | Admitting: Family Medicine

## 2017-05-02 ENCOUNTER — Other Ambulatory Visit (HOSPITAL_COMMUNITY)
Admission: RE | Admit: 2017-05-02 | Discharge: 2017-05-02 | Disposition: A | Discharge status: Home / Self Care | Payer: BC Managed Care – PPO | Source: Ambulatory Visit | Attending: Family Medicine | Admitting: Family Medicine

## 2017-05-02 DIAGNOSIS — Z124 Encounter for screening for malignant neoplasm of cervix: Secondary | ICD-10-CM | POA: Insufficient documentation

## 2017-05-07 LAB — CYTOLOGY - PAP
Diagnosis: NEGATIVE
HPV: NOT DETECTED

## 2017-10-09 ENCOUNTER — Emergency Department (HOSPITAL_COMMUNITY)
Admission: EM | Admit: 2017-10-09 | Discharge: 2017-10-09 | Disposition: A | Discharge status: Home / Self Care | Payer: BC Managed Care – PPO | Attending: Emergency Medicine | Admitting: Emergency Medicine

## 2017-10-09 ENCOUNTER — Encounter (HOSPITAL_COMMUNITY): Payer: Self-pay | Admitting: *Deleted

## 2017-10-09 DIAGNOSIS — Z87891 Personal history of nicotine dependence: Secondary | ICD-10-CM | POA: Insufficient documentation

## 2017-10-09 DIAGNOSIS — Z79899 Other long term (current) drug therapy: Secondary | ICD-10-CM | POA: Diagnosis not present

## 2017-10-09 DIAGNOSIS — M545 Low back pain: Secondary | ICD-10-CM | POA: Insufficient documentation

## 2017-10-09 DIAGNOSIS — M7918 Myalgia, other site: Secondary | ICD-10-CM | POA: Insufficient documentation

## 2017-10-09 DIAGNOSIS — M79662 Pain in left lower leg: Secondary | ICD-10-CM | POA: Diagnosis not present

## 2017-10-09 MED ORDER — KETOROLAC TROMETHAMINE 30 MG/ML IJ SOLN
30.0000 mg | Freq: Once | INTRAMUSCULAR | Status: AC
  Administered 2017-10-09: 30 mg via INTRAMUSCULAR
  Filled 2017-10-09: qty 1

## 2017-10-09 MED ORDER — METHOCARBAMOL 500 MG PO TABS: 500.0000 mg | ORAL_TABLET | Freq: Two times a day (BID) | ORAL | 0 refills | Status: DC
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
## 2017-10-09 NOTE — ED Notes (Signed)
Patient given discharge instructions and verbalized understanding.  Patient stable to discharge at this time.  Patient is alert and oriented to baseline.  No distressed noted at this time.  All belongings taken with the patient at discharge.   

## 2017-10-09 NOTE — Discharge Instructions (Addendum)
Please read attached information. If you experience any new or worsening signs or symptoms please return to the emergency room for evaluation. Please follow-up with your primary care provider or specialist as discussed. Please use medication prescribed only as directed and discontinue taking if you have any concerning signs or symptoms.   °

## 2018-02-09 ENCOUNTER — Other Ambulatory Visit: Payer: Self-pay | Admitting: Family

## 2018-02-09 DIAGNOSIS — H60332 Swimmer's ear, left ear: Secondary | ICD-10-CM

## 2018-02-09 MED ORDER — NEOMYCIN-POLYMYXIN-HC 3.5-10000-1 OT SOLN: 3.0000 [drp] | Freq: Four times a day (QID) | OTIC | 0 refills | Status: DC

## 2019-03-04 DIAGNOSIS — M79674 Pain in right toe(s): Secondary | ICD-10-CM | POA: Diagnosis not present

## 2019-03-04 DIAGNOSIS — N939 Abnormal uterine and vaginal bleeding, unspecified: Secondary | ICD-10-CM | POA: Diagnosis not present

## 2019-03-04 DIAGNOSIS — R102 Pelvic and perineal pain: Secondary | ICD-10-CM | POA: Diagnosis not present

## 2019-03-04 DIAGNOSIS — K2900 Acute gastritis without bleeding: Secondary | ICD-10-CM | POA: Diagnosis not present

## 2019-03-06 ENCOUNTER — Other Ambulatory Visit: Payer: Self-pay | Admitting: Family Medicine

## 2019-03-06 DIAGNOSIS — R102 Pelvic and perineal pain: Secondary | ICD-10-CM

## 2019-03-12 ENCOUNTER — Ambulatory Visit
Admission: RE | Admit: 2019-03-12 | Discharge: 2019-03-12 | Disposition: A | Discharge status: Home / Self Care | Payer: BLUE CROSS/BLUE SHIELD | Source: Ambulatory Visit | Attending: Family Medicine | Admitting: Family Medicine

## 2019-03-12 DIAGNOSIS — N939 Abnormal uterine and vaginal bleeding, unspecified: Secondary | ICD-10-CM | POA: Diagnosis not present

## 2019-03-12 DIAGNOSIS — R102 Pelvic and perineal pain: Secondary | ICD-10-CM

## 2019-03-20 ENCOUNTER — Ambulatory Visit (INDEPENDENT_AMBULATORY_CARE_PROVIDER_SITE_OTHER): Payer: BC Managed Care – PPO | Admitting: Podiatry

## 2019-03-20 ENCOUNTER — Encounter: Payer: Self-pay | Admitting: Podiatry

## 2019-03-20 ENCOUNTER — Ambulatory Visit (INDEPENDENT_AMBULATORY_CARE_PROVIDER_SITE_OTHER): Payer: BC Managed Care – PPO

## 2019-03-20 ENCOUNTER — Other Ambulatory Visit: Payer: Self-pay

## 2019-03-20 VITALS — BP 148/95 | HR 79 | Resp 16

## 2019-03-20 DIAGNOSIS — M5431 Sciatica, right side: Secondary | ICD-10-CM | POA: Diagnosis not present

## 2019-03-20 DIAGNOSIS — M7751 Other enthesopathy of right foot: Secondary | ICD-10-CM | POA: Diagnosis not present
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
## 2019-03-21 ENCOUNTER — Telehealth: Payer: Self-pay | Admitting: *Deleted

## 2019-03-21 DIAGNOSIS — M5431 Sciatica, right side: Secondary | ICD-10-CM
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
## 2019-03-21 NOTE — Telephone Encounter (Signed)
Referral, clinicals and demographics faxed to [facility]

## 2019-03-28 DIAGNOSIS — M21611 Bunion of right foot: Secondary | ICD-10-CM | POA: Diagnosis not present

## 2019-03-28 DIAGNOSIS — M545 Low back pain: Secondary | ICD-10-CM | POA: Diagnosis not present

## 2019-04-10 DIAGNOSIS — N921 Excessive and frequent menstruation with irregular cycle: Secondary | ICD-10-CM | POA: Diagnosis not present

## 2019-04-10 DIAGNOSIS — N842 Polyp of vagina: Secondary | ICD-10-CM | POA: Diagnosis not present

## 2019-04-10 DIAGNOSIS — D219 Benign neoplasm of connective and other soft tissue, unspecified: Secondary | ICD-10-CM | POA: Diagnosis not present

## 2019-04-30 DIAGNOSIS — N921 Excessive and frequent menstruation with irregular cycle: Secondary | ICD-10-CM | POA: Diagnosis not present

## 2019-05-08 ENCOUNTER — Other Ambulatory Visit: Payer: Self-pay

## 2019-05-08 ENCOUNTER — Encounter (HOSPITAL_COMMUNITY): Payer: Self-pay

## 2019-05-08 ENCOUNTER — Encounter (HOSPITAL_COMMUNITY)
Admission: RE | Admit: 2019-05-08 | Discharge: 2019-05-08 | Disposition: A | Discharge status: Home / Self Care | Payer: BC Managed Care – PPO | Source: Ambulatory Visit | Attending: Obstetrics and Gynecology | Admitting: Obstetrics and Gynecology

## 2019-05-08 DIAGNOSIS — Z01812 Encounter for preprocedural laboratory examination: Secondary | ICD-10-CM | POA: Diagnosis not present

## 2019-05-08 HISTORY — DX: Hemorrhage, not elsewhere classified: R58

## 2019-05-08 HISTORY — DX: Unspecified abdominal pain: R10.9

## 2019-05-08 LAB — COMPREHENSIVE METABOLIC PANEL
ALT: 16 U/L (ref 0–44)
AST: 22 U/L (ref 15–41)
Albumin: 4 g/dL (ref 3.5–5.0)
Alkaline Phosphatase: 55 U/L (ref 38–126)
Anion gap: 12 (ref 5–15)
BUN: 8 mg/dL (ref 6–20)
CO2: 22 mmol/L (ref 22–32)
Calcium: 9 mg/dL (ref 8.9–10.3)
Chloride: 102 mmol/L (ref 98–111)
Creatinine, Ser: 0.7 mg/dL (ref 0.44–1.00)
GFR calc Af Amer: >60 mL/min (ref >60)
GFR calc non Af Amer: >60 mL/min (ref >60)
Glucose, Bld: 120 mg/dL — ABNORMAL HIGH (ref 70–99)
Potassium: 3.4 mmol/L — ABNORMAL LOW (ref 3.5–5.1)
Sodium: 136 mmol/L (ref 135–145)
Total Bilirubin: 1.4 mg/dL — ABNORMAL HIGH (ref 0.3–1.2)
Total Protein: 7.2 g/dL (ref 6.5–8.1)

## 2019-05-08 LAB — CBC
HCT: 41.4 % (ref 36.0–46.0)
Hemoglobin: 13.9 g/dL (ref 12.0–15.0)
MCH: 35.5 pg — ABNORMAL HIGH (ref 26.0–34.0)
MCHC: 33.6 g/dL (ref 30.0–36.0)
MCV: 105.6 fL — ABNORMAL HIGH (ref 80.0–100.0)
Platelets: 256 10*3/uL (ref 150–400)
RBC: 3.92 MIL/uL (ref 3.87–5.11)
RDW: 11.9 % (ref 11.5–15.5)
WBC: 10.4 10*3/uL (ref 4.0–10.5)
nRBC: 0 % (ref 0.0–0.2)

## 2019-05-08 NOTE — Pre-Procedure Instructions (Signed)
[name]  05/08/2019      [id number] [address], [place] [address] Phone: [phone] Fax: [phone]    Your procedure is scheduled on 05/14/19.  Report to [facility] Admitting at 930 A.M.  Call this number if you have problems the morning of surgery:  [phone]   Remember:  Do not eat or drink after midnight.      Take these medicines the morning of surgery with A SIP OF WATER ----    Do not wear jewelry, make-up or nail polish.  Do not wear lotions, powders, or perfumes, or deodorant.  Do not shave 48 hours prior to surgery.  Men may shave face and neck.  Do not bring valuables to the hospital.  [facility] is not responsible for any belongings or valuables.  Contacts, dentures or bridgework may not be worn into surgery.  Leave your suitcase in the car.  After surgery it may be brought to your room.  For patients admitted to the hospital, discharge time will be determined by your treatment team.  Patients discharged the day of surgery will not be allowed to drive home.    Special instructions:  Do not take any aspirin,anti-inflammatories,vitamins,or herbal supplements 5-7 days prior to surgery.  Please read over the following fact sheets that you were given.    [facility] - Preparing for Surgery  Before surgery, you can play an important role.  Because skin is not sterile, your skin needs to be as free of germs as possible.  You can reduce the number of germs on you skin by washing with CHG (chlorahexidine gluconate) soap before surgery.  CHG is an antiseptic cleaner which kills germs and bonds with the skin to continue killing germs even after washing.  Oral Hygiene is also important in reducing the risk of infection.  Remember to brush your teeth with your regular toothpaste the morning of surgery.  Please DO NOT use if you have an allergy to CHG or antibacterial soaps.  If  your skin becomes reddened/irritated stop using the CHG and inform your nurse when you arrive at Short Stay.  Do not shave (including legs and underarms) for at least 48 hours prior to the first CHG shower.  You may shave your face.  Please follow these instructions carefully:   1.  Shower with CHG Soap the night before surgery and the morning of Surgery.  2.  If you choose to wash your hair, wash your hair first as usual with your normal shampoo.  3.  After you shampoo, rinse your hair and body thoroughly to remove the shampoo. 4.  Use CHG as you would any other liquid soap.  You can apply chg directly to the skin and wash gently with a      scrungie or washcloth.           5.  Apply the CHG Soap to your body ONLY FROM THE NECK DOWN.   Do not use on open wounds or open sores. Avoid contact with your eyes, ears, mouth and genitals (private parts).  Wash genitals (private parts) with your normal soap.  6.  Wash thoroughly, paying special attention to the area where your surgery will be performed.  7.  Thoroughly rinse your body with warm water from the neck down.  8.  DO NOT shower/wash with your normal soap after using and rinsing off the CHG Soap.  9.  Pat yourself dry with a clean towel.            10.  Wear clean pajamas.            11.  Place clean sheets on your bed the night of your first shower and do not sleep with pets.  Day of Surgery  Do not apply any lotions/deoderants the morning of surgery.   Please wear clean clothes to the hospital/surgery center. Remember to brush your teeth with toothpaste.

## 2019-05-12 ENCOUNTER — Other Ambulatory Visit (HOSPITAL_COMMUNITY)
Admission: RE | Admit: 2019-05-12 | Discharge: 2019-05-12 | Disposition: A | Discharge status: Home / Self Care | Payer: BC Managed Care – PPO | Source: Ambulatory Visit | Attending: Obstetrics and Gynecology | Admitting: Obstetrics and Gynecology

## 2019-05-12 DIAGNOSIS — Z01812 Encounter for preprocedural laboratory examination: Secondary | ICD-10-CM | POA: Insufficient documentation

## 2019-05-12 DIAGNOSIS — Z20828 Contact with and (suspected) exposure to other viral communicable diseases: Secondary | ICD-10-CM | POA: Diagnosis not present

## 2019-05-12 LAB — SARS CORONAVIRUS 2 (TAT 6-24 HRS): SARS Coronavirus 2: NEGATIVE

## 2019-05-13 ENCOUNTER — Other Ambulatory Visit: Payer: Self-pay | Admitting: Obstetrics and Gynecology

## 2019-05-13 NOTE — H&P (Signed)
Reason for Appointment  1. Pre op   History of Present Illness  Isolation Precautions:  Respiratory Illness Screening 1. Is fever present / reported? No, 2. Are respiratory illness symptom(s) present / reported? No, 3. Are other symptom(s) present / reported? No, 5. Has there been reported travel to a High Risk respiratory illness region? No, 6. Has close* contact with person(s) known to have communicable illness been reported? No, 7. Did travel or close contact (if applicable) occur within 14 days of symptom onset? No.  General:  38 y/o presents for pre op visit She is scheduled for a hysteroscopy D&C with hydrothermal ablation for the management of menorrhagia on 05/14/2019. Pelvic U/s on 03/12/2019 revealed a uterus measuring 11.2x4.6x6.4 cm endometrial thickness measured 14.6 mm, lt ov not visualized, hyperechoic posterior mass measured 3.1 cm. rt ov wnl. Results discussed with pt. Pt c/o AUB, states she was bleeding for 20 days straight. She also c/o left side pelvic pain, 3/10. She reports her menses became heavier and longer within the past year. She has some concern about her anxiety and depression.  She denies experiencing vaginal discharge in office today. Upon pelvic exam was overall normal.   Current Medications  Not-Taking   Lysteda(Tranexamic Acid) 650 MG Tablet 2 tablets Orally three times daily x up to 5 days w/ menses   Multivitamin - Tablet 1 tablet Orally Once a day   Omeprazole 40 MG Capsule Delayed Release 1 capsule 30 minutes before morning meal Orally Once a day   Medication List reviewed and reconciled with the patient    Past Medical History  fatty infiltration of the liver. Ultrasound 2016 [facility]).   History of alcohol dependence.   History of pernicious anemia.    Surgical History  tubal ligation 2008   Family History  Father: alive  Mother: deceased  1 son(s) , 1 daughter(s) .   denies any GYN family cancer hx.   Social History  General:   Alcohol: yes, 2-3 glasses of wine/day. Children: 1, Boys, 1, girls. Tobacco use cigarettes: Never smoked, Tobacco history last updated 04/30/2019. Marital Status: widowed. no Recreational drug use. OCCUPATION: employed, works for [employer].    Gyn History  Sexual activity currently sexually active.  Periods : irregular.  LMP 04/14/2019.  Birth control BTL.  Last pap smear date 05/02/2017-Neg/HPV neg.  Last mammogram date years ago.  Denies H/O Abnormal pap smear.  Menarche 40.  GYN procedures Pelvic U/S.    OB History  Number of pregnancies 2.  Pregnancy # 1 live birth, boy, vaginal delivery.  Pregnancy # 2 live birth, girl, vaginal delivery.    Allergies  N.K.D.A.   Hospitalization/Major Diagnostic Procedure  alcoholic pancreatitis March 2018   Review of Systems  CONSTITUTIONAL:  Chills No. Fatigue No. Fever No. Night sweats No. Recent travel outside [place] No. Sweats No. Weight change No.  OPHTHALMOLOGY:  Blurring of vision no. Change in vision no. Double vision no.  ENT:  Dizziness no. Nose bleeds no. Sore throat no. Teeth pain no.  ALLERGY:  Hives no.  CARDIOLOGY:  Chest pain no. High blood pressure no. Irregular heart beat no. Leg edema no. Palpitations no.  RESPIRATORY:  Shortness of breath no. Cough no. Wheezing no.  UROLOGY:  Pain with urination no. Urinary urgency no. Urinary frequency no. Urinary incontinence no. Difficulty urinating No. Blood in urine No.  GASTROENTEROLOGY:  Abdominal pain no. Appetite change no. Bloating/belching no. Blood in stool or on toilet paper no. Change in bowel  movements no. Constipation no. Diarrhea no. Difficulty swallowing no. Nausea no.  FEMALE REPRODUCTIVE:  Vulvar pain no. Vulvar rash no. Abnormal vaginal bleeding yes. Breast pain no. Nipple discharge no. Pain with intercourse no. Pelvic pain no. Unusual vaginal discharge no. Vaginal itching no.  MUSCULOSKELETAL:  Muscle aches no.  NEUROLOGY:  Headache no. Tingling/numbness  no. Weakness no.  PSYCHOLOGY:  Depression no. Anxiety no. Nervousness no. Sleep disturbances no. Suicidal ideation no .  ENDOCRINOLOGY:  Excessive thirst no. Excessive urination no. Hair loss no. Heat or cold intolerance no.  HEMATOLOGY/LYMPH:  Abnormal bleeding no. Easy bruising no. Swollen glands no.  DERMATOLOGY:  New/changing skin lesion no. Rash no. Sores no.  Negative except as stated in HPI.    Vital Signs  Wt 193, Wt change 1.6 lb, Ht 66, BMI 31.15, Temp 98.1, Pulse sitting 82, BP sitting 130/88.   Examination  General Examination: CONSTITUTIONAL: alert, oriented, NAD . SKIN: moist, warm. EYES: Conjunctiva clear. LUNGS: clear to auscultation bilaterally. HEART: regular rate and rhythm. ABDOMEN: soft, non-tender/non-distended, bowel sounds present . FEMALE GENITOURINARY: normal external genitalia, labia - unremarkable, vagina - pink moist mucosa, no lesions or abnormal discharge, cervix - no discharge or lesions or CMT, adnexa - no masses or tenderness, uterus - nontender and normal size on palpation . PSYCH: affect normal, good eye contact.     Physical Examination  Chaperone present:  Chaperone present for pelvic exam Chapman,Courtney 04/30/2019 12:12:09 PM > .  Pt aware of scribe services today.   Assessments   1. Menorrhagia with irregular cycle - N92.1 (Primary)   Treatment  1. Menorrhagia with irregular cycle  Notes: She is scheduled for a hysteroscopy D&C with hydrothermal ablation for the management of menorrhagia on 05/14/2019. Discussed there is risk of infection, bleeding and perforation of the uterus with this surgery. Discussed risk of blood transfusion. Discussed risk of hiv/hep b&c with blood transfusion. Patient is aware of risks and wishes to receive if warranted. No eating or drinking night prior to surgery. Pt. advised to avoid aspirin, aleve, ibuprofen from now until surgery. She is advised no sexual intercourse for 2 weeks following surgery. She is advised  she will have covid testing 72 hours prior to surgery date. Plan to see 4 weeks for post op visit.

## 2019-05-14 ENCOUNTER — Encounter (HOSPITAL_COMMUNITY): Payer: Self-pay | Admitting: *Deleted

## 2019-05-14 ENCOUNTER — Encounter (HOSPITAL_COMMUNITY)
Admission: RE | Disposition: A | Discharge status: Home / Self Care | Payer: Self-pay | Source: Home / Self Care | Attending: Obstetrics and Gynecology

## 2019-05-14 ENCOUNTER — Other Ambulatory Visit: Payer: Self-pay

## 2019-05-14 ENCOUNTER — Ambulatory Visit (HOSPITAL_COMMUNITY): Payer: BC Managed Care – PPO | Admitting: Certified Registered"

## 2019-05-14 ENCOUNTER — Ambulatory Visit (HOSPITAL_COMMUNITY)
Admission: RE | Admit: 2019-05-14 | Discharge: 2019-05-14 | Disposition: A | Discharge status: Home / Self Care | Payer: BC Managed Care – PPO | Attending: Obstetrics and Gynecology | Admitting: Obstetrics and Gynecology

## 2019-05-14 DIAGNOSIS — Z79899 Other long term (current) drug therapy: Secondary | ICD-10-CM | POA: Diagnosis not present

## 2019-05-14 DIAGNOSIS — D219 Benign neoplasm of connective and other soft tissue, unspecified: Secondary | ICD-10-CM | POA: Diagnosis present

## 2019-05-14 DIAGNOSIS — K760 Fatty (change of) liver, not elsewhere classified: Secondary | ICD-10-CM | POA: Insufficient documentation

## 2019-05-14 DIAGNOSIS — N921 Excessive and frequent menstruation with irregular cycle: Secondary | ICD-10-CM | POA: Insufficient documentation

## 2019-05-14 DIAGNOSIS — N840 Polyp of corpus uteri: Secondary | ICD-10-CM | POA: Diagnosis not present

## 2019-05-14 DIAGNOSIS — D649 Anemia, unspecified: Secondary | ICD-10-CM | POA: Diagnosis not present

## 2019-05-14 DIAGNOSIS — F329 Major depressive disorder, single episode, unspecified: Secondary | ICD-10-CM | POA: Diagnosis not present

## 2019-05-14 HISTORY — PX: DILITATION & CURRETTAGE/HYSTROSCOPY WITH HYDROTHERMAL ABLATION: SHX5570

## 2019-05-14 LAB — POCT PREGNANCY, URINE: Preg Test, Ur: NEGATIVE

## 2019-05-14 SURGERY — DILATATION & CURETTAGE/HYSTEROSCOPY WITH HYDROTHERMAL ABLATION
Anesthesia: General | Site: Vagina

## 2019-05-14 MED ORDER — LIDOCAINE IN D5W 4-5 MG/ML-% IV SOLN
1.0000 mg/min | INTRAVENOUS | Status: DC
  Filled 2019-05-14: qty 500

## 2019-05-14 MED ORDER — DEXAMETHASONE SODIUM PHOSPHATE 10 MG/ML IJ SOLN
INTRAMUSCULAR | Status: AC
  Filled 2019-05-14: qty 1

## 2019-05-14 MED ORDER — BUPIVACAINE HCL (PF) 0.25 % IJ SOLN
INTRAMUSCULAR | Status: DC | PRN
  Administered 2019-05-14: 10 mL

## 2019-05-14 MED ORDER — SODIUM CHLORIDE 0.9 % IR SOLN
Status: DC | PRN
  Administered 2019-05-14: 3000 mL

## 2019-05-14 MED ORDER — FENTANYL CITRATE (PF) 250 MCG/5ML IJ SOLN
INTRAMUSCULAR | Status: DC | PRN
  Administered 2019-05-14: 50 ug via INTRAVENOUS
  Administered 2019-05-14 (×2): 25 ug via INTRAVENOUS

## 2019-05-14 MED ORDER — ACETAMINOPHEN 325 MG PO TABS: 325.0000 mg | ORAL_TABLET | Freq: Once | ORAL | Status: DC | PRN

## 2019-05-14 MED ORDER — DEXAMETHASONE SODIUM PHOSPHATE 10 MG/ML IJ SOLN
INTRAMUSCULAR | Status: DC | PRN
  Administered 2019-05-14: 10 mg via INTRAVENOUS

## 2019-05-14 MED ORDER — FENTANYL CITRATE (PF) 100 MCG/2ML IJ SOLN: 25.0000 ug | INTRAMUSCULAR | Status: DC | PRN

## 2019-05-14 MED ORDER — ONDANSETRON HCL 4 MG/2ML IJ SOLN
INTRAMUSCULAR | Status: AC
  Filled 2019-05-14: qty 2

## 2019-05-14 MED ORDER — GLYCOPYRROLATE PF 0.2 MG/ML IJ SOSY
PREFILLED_SYRINGE | INTRAMUSCULAR | Status: AC
  Filled 2019-05-14: qty 1

## 2019-05-14 MED ORDER — MEPERIDINE HCL 25 MG/ML IJ SOLN: 6.2500 mg | INTRAMUSCULAR | Status: DC | PRN

## 2019-05-14 MED ORDER — MIDAZOLAM HCL 5 MG/5ML IJ SOLN
INTRAMUSCULAR | Status: DC | PRN
  Administered 2019-05-14: 2 mg via INTRAVENOUS

## 2019-05-14 MED ORDER — LIDOCAINE 2% (20 MG/ML) 5 ML SYRINGE
INTRAMUSCULAR | Status: DC | PRN
  Administered 2019-05-14: 80 mg via INTRAVENOUS

## 2019-05-14 MED ORDER — CEFAZOLIN SODIUM-DEXTROSE 2-3 GM-%(50ML) IV SOLR
INTRAVENOUS | Status: DC | PRN
  Administered 2019-05-14: 2 g via INTRAVENOUS

## 2019-05-14 MED ORDER — ACETAMINOPHEN 160 MG/5ML PO SOLN: 325.0000 mg | Freq: Once | ORAL | Status: DC | PRN

## 2019-05-14 MED ORDER — ACETAMINOPHEN 10 MG/ML IV SOLN
INTRAVENOUS | Status: DC | PRN
  Administered 2019-05-14: 1000 mg via INTRAVENOUS

## 2019-05-14 MED ORDER — ARTIFICIAL TEARS OPHTHALMIC OINT
TOPICAL_OINTMENT | OPHTHALMIC | Status: AC
  Filled 2019-05-14: qty 3.5

## 2019-05-14 MED ORDER — FENTANYL CITRATE (PF) 100 MCG/2ML IJ SOLN
INTRAMUSCULAR | Status: AC
  Filled 2019-05-14: qty 2

## 2019-05-14 MED ORDER — ACETAMINOPHEN 10 MG/ML IV SOLN: 1000.0000 mg | Freq: Once | INTRAVENOUS | Status: DC | PRN

## 2019-05-14 MED ORDER — ONDANSETRON HCL 4 MG/2ML IJ SOLN
INTRAMUSCULAR | Status: DC | PRN
  Administered 2019-05-14: 4 mg via INTRAVENOUS

## 2019-05-14 MED ORDER — LACTATED RINGERS IV SOLN
INTRAVENOUS | Status: DC
  Administered 2019-05-14: 10:00:00 via INTRAVENOUS

## 2019-05-14 MED ORDER — PROPOFOL 10 MG/ML IV BOLUS
INTRAVENOUS | Status: DC | PRN
  Administered 2019-05-14: 200 mg via INTRAVENOUS

## 2019-05-14 MED ORDER — PROMETHAZINE HCL 25 MG/ML IJ SOLN: 6.2500 mg | INTRAMUSCULAR | Status: DC | PRN

## 2019-05-14 MED ORDER — LIDOCAINE 2% (20 MG/ML) 5 ML SYRINGE
INTRAMUSCULAR | Status: AC
  Filled 2019-05-14: qty 5

## 2019-05-14 MED ORDER — PROPOFOL 10 MG/ML IV BOLUS
INTRAVENOUS | Status: AC
  Filled 2019-05-14: qty 20

## 2019-05-14 MED ORDER — IBUPROFEN 800 MG PO TABS: 800.0000 mg | ORAL_TABLET | Freq: Three times a day (TID) | ORAL | 0 refills | Status: AC | PRN

## 2019-05-14 MED ORDER — CEFAZOLIN SODIUM-DEXTROSE 2-4 GM/100ML-% IV SOLN
INTRAVENOUS | Status: AC
  Filled 2019-05-14: qty 100

## 2019-05-14 MED ORDER — KETOROLAC TROMETHAMINE 30 MG/ML IJ SOLN
INTRAMUSCULAR | Status: DC | PRN
  Administered 2019-05-14: 30 mg via INTRAVENOUS

## 2019-05-14 MED ORDER — MIDAZOLAM HCL 2 MG/2ML IJ SOLN
INTRAMUSCULAR | Status: AC
  Filled 2019-05-14: qty 2

## 2019-05-14 MED ORDER — LACTATED RINGERS IV SOLN: INTRAVENOUS | Status: DC

## 2019-05-14 SURGICAL SUPPLY — 16 items
CANISTER SUCT 3000ML PPV (MISCELLANEOUS) ×3 IMPLANT
CATH ROBINSON RED A/P 16FR (CATHETERS) ×3 IMPLANT
ELECT REM PT RETURN 9FT ADLT (ELECTROSURGICAL)
ELECTRODE REM PT RTRN 9FT ADLT (ELECTROSURGICAL) IMPLANT
GLOVE BIOGEL M 6.5 STRL (GLOVE) ×6 IMPLANT
GLOVE BIOGEL PI IND STRL 6.5 (GLOVE) ×1 IMPLANT
GLOVE BIOGEL PI IND STRL 7.0 (GLOVE) ×1 IMPLANT
GLOVE BIOGEL PI INDICATOR 6.5 (GLOVE) ×2
GLOVE BIOGEL PI INDICATOR 7.0 (GLOVE) ×2
GOWN STRL REUS W/ TWL LRG LVL3 (GOWN DISPOSABLE) ×2 IMPLANT
GOWN STRL REUS W/TWL LRG LVL3 (GOWN DISPOSABLE) ×6
PACK VAGINAL MINOR WOMEN LF (CUSTOM PROCEDURE TRAY) ×3 IMPLANT
PAD OB MATERNITY 4.3X12.25 (PERSONAL CARE ITEMS) ×3 IMPLANT
SET GENESYS HTA PROCERVA (MISCELLANEOUS) ×2 IMPLANT
TOWEL GREEN STERILE FF (TOWEL DISPOSABLE) ×6 IMPLANT
UNDERPAD 30X36 HEAVY ABSORB (UNDERPADS AND DIAPERS) ×3 IMPLANT

## 2019-05-14 NOTE — Anesthesia Preprocedure Evaluation (Signed)
Anesthesia Evaluation  Patient identified by MRN, date of birth, ID band Patient awake    Reviewed: Allergy & Precautions, NPO status , Patient's Chart, lab work & pertinent test results  Airway Mallampati: II  TM Distance: >3 FB Neck ROM: Full    Dental  (+) Teeth Intact, Dental Advisory Given   Pulmonary former smoker,    breath sounds clear to auscultation       Cardiovascular negative cardio ROS   Rhythm:Regular Rate:Normal     Neuro/Psych Depression    GI/Hepatic negative GI ROS, Neg liver ROS,   Endo/Other  negative endocrine ROS  Renal/GU negative Renal ROS     Musculoskeletal negative musculoskeletal ROS (+)   Abdominal Normal abdominal exam  (+)   Peds  Hematology   Anesthesia Other Findings   Reproductive/Obstetrics                             Anesthesia Physical Anesthesia Plan  ASA: II  Anesthesia Plan: General   Post-op Pain Management:    Induction: Intravenous  PONV Risk Score and Plan: 4 or greater and Ondansetron, Dexamethasone, Midazolam and Scopolamine patch - Pre-op  Airway Management Planned: LMA  Additional Equipment: None  Intra-op Plan:   Post-operative Plan: Extubation in OR  Informed Consent: I have reviewed the patients History and Physical, chart, labs and discussed the procedure including the risks, benefits and alternatives for the proposed anesthesia with the patient or authorized representative who has indicated his/her understanding and acceptance.     Dental advisory given  Plan Discussed with: CRNA  Anesthesia Plan Comments:         Anesthesia Quick Evaluation

## 2019-05-14 NOTE — H&P (Signed)
Date of Initial H&P: 05/13/19  History reviewed, patient examined, no change in status, stable for surgery.

## 2019-05-14 NOTE — Anesthesia Postprocedure Evaluation (Signed)
Anesthesia Post Note  Patient: Elberta Fortis  Procedure(s) Performed: DILATATION & CURETTAGE/HYSTEROSCOPY WITH HYDROTHERMAL ABLATION (N/A Vagina )     Patient location during evaluation: PACU Anesthesia Type: General Level of consciousness: awake and alert Pain management: pain level controlled Vital Signs Assessment: post-procedure vital signs reviewed and stable Respiratory status: spontaneous breathing, nonlabored ventilation, respiratory function stable and patient connected to nasal cannula oxygen Cardiovascular status: blood pressure returned to baseline and stable Postop Assessment: no apparent nausea or vomiting Anesthetic complications: no    Last Vitals:  Vitals:   05/14/19 1255 05/14/19 1308  BP: (!) 133/91 (!) 141/96  Pulse: 73 71  Resp: 17 18  Temp:  (!) 36.1 C  SpO2: 100% 100%    Last Pain:  Vitals:   05/14/19 1308  TempSrc:   PainSc: 0-No pain                 Effie Berkshire

## 2019-05-14 NOTE — Op Note (Signed)
05/14/2019  12:28 PM  PATIENT:  [name]  38 y.o. female  PRE-OPERATIVE DIAGNOSIS:  N92.1 Menorrhagia with irregular cycle  POST-OPERATIVE DIAGNOSIS:  menorrhagia with irregular cycle  PROCEDURE:  Procedure(s): DILATATION & CURETTAGE/HYSTEROSCOPY WITH HYDROTHERMAL ABLATION (N/A)  SURGEON:  Surgeon(s) and Role:    Christophe Louis, MD - Primary  PHYSICIAN ASSISTANT:   ASSISTANTS: none   ANESTHESIA:   general  EBL:  25 mL   BLOOD ADMINISTERED:none  DRAINS: none   LOCAL MEDICATIONS USED:  MARCAINE     SPECIMEN:  Source of Specimen:  Endometrial currettings   DISPOSITION OF SPECIMEN:  PATHOLOGY  COUNTS:  YES  TOURNIQUET:  * No tourniquets in log *  DICTATION: .Dragon Dictation  PLAN OF CARE: Discharge to home after PACU  PATIENT DISPOSITION:  PACU - hemodynamically stable.   Delay start of Pharmacological VTE agent (>24hrs) due to surgical blood loss or risk of bleeding: not applicable  Findings: normal external genitalia.Marland Kitchen 3 cm vaginal polyp... normal appearing cervix... proliferative appearing endometrium.   Procedure. The patient was taken to the operative room were a time out was performed. She was placed in the dorsal lithotomy position and prepped and draped in the normal sterile fashion. A speculum was placed in the vaginal vault. The anterior lip of the cervix was grasped with a single tooth tenaculum. The uterus was sounded to 9 cm. The cervix was dilated to 8 mm. The hydrothermal hysteroscope was inserted. The ostia were visualized bilaterally. There was no evidence of perforation. The hydrothermal ablation was performed for 10 minutes.  The hysteroscope was removed. Endometrial currettings were obtained. Diagnositc hysteroscopy was resumed. No evidence of perforation.  10 cc of 25% marcaine was injected at the 4 and 8 oclock position.  The single tooth tenaculum was removed.   Bleeding noted from the right side of the tenaculum site.. silver nitrate was  applied. Excellent hemostasis was noted.   Sponge lap and needle counts were correct x 2.  The patient was awakened from anesthesia and taken to the recovery room in stable condition.

## 2019-05-14 NOTE — Transfer of Care (Signed)
Immediate Anesthesia Transfer of Care Note  Patient: [name]  Procedure(s) Performed: DILATATION & CURETTAGE/HYSTEROSCOPY WITH HYDROTHERMAL ABLATION (N/A Vagina )  Patient Location: PACU  Anesthesia Type:General  Level of Consciousness: awake, alert , oriented and patient cooperative  Airway & Oxygen Therapy: Patient Spontanous Breathing and Patient connected to face mask oxygen  Post-op Assessment: Report given to RN and Post -op Vital signs reviewed and stable  Post vital signs: Reviewed and stable  Last Vitals:  Vitals Value Taken Time  BP 146/122 05/14/19 1238  Temp    Pulse 80 05/14/19 1238  Resp 21 05/14/19 1238  SpO2 100 % 05/14/19 1238  Vitals shown include unvalidated device data.  Last Pain:  Vitals:   05/14/19 0953  TempSrc:   PainSc: 4       Patients Stated Pain Goal: 3 ([phone])  Complications: No apparent anesthesia complications

## 2019-05-14 NOTE — Anesthesia Procedure Notes (Signed)
Procedure Name: LMA Insertion Date/Time: 05/14/2019 11:37 AM Performed by: Cleda Daub, CRNA Pre-anesthesia Checklist: Patient identified, Emergency Drugs available, Suction available and Patient being monitored Patient Re-evaluated:Patient Re-evaluated prior to induction Oxygen Delivery Method: Circle system utilized Preoxygenation: Pre-oxygenation with 100% oxygen Induction Type: IV induction LMA: LMA inserted LMA Size: 4.0 Number of attempts: 1 Placement Confirmation: positive ETCO2 and breath sounds checked- equal and bilateral Tube secured with: Tape Dental Injury: Teeth and Oropharynx as per pre-operative assessment

## 2019-05-15 ENCOUNTER — Encounter (HOSPITAL_COMMUNITY): Payer: Self-pay | Admitting: Obstetrics and Gynecology

## 2019-05-15 DIAGNOSIS — R1084 Generalized abdominal pain: Secondary | ICD-10-CM | POA: Diagnosis not present

## 2019-05-15 DIAGNOSIS — R140 Abdominal distension (gaseous): Secondary | ICD-10-CM | POA: Diagnosis not present

## 2019-05-15 DIAGNOSIS — R197 Diarrhea, unspecified: Secondary | ICD-10-CM | POA: Diagnosis not present

## 2019-05-15 DIAGNOSIS — Z800 Family history of malignant neoplasm of digestive organs: Secondary | ICD-10-CM | POA: Diagnosis not present

## 2019-05-15 LAB — SURGICAL PATHOLOGY

## 2019-05-26 DIAGNOSIS — Z1159 Encounter for screening for other viral diseases: Secondary | ICD-10-CM | POA: Diagnosis not present

## 2019-05-28 DIAGNOSIS — K640 First degree hemorrhoids: Secondary | ICD-10-CM | POA: Diagnosis not present

## 2019-05-28 DIAGNOSIS — Z800 Family history of malignant neoplasm of digestive organs: Secondary | ICD-10-CM | POA: Diagnosis not present

## 2019-05-28 DIAGNOSIS — R197 Diarrhea, unspecified: Secondary | ICD-10-CM | POA: Diagnosis not present

## 2019-12-18 IMAGING — US US PELVIS COMPLETE WITH TRANSVAGINAL
1 series · 13 of 25 positions shown · non-contrast
Comparison: CT 04/07/2013

CLINICAL DATA: Abnormal uterine bleeding left pelvic pain

EXAM:
TRANSABDOMINAL AND TRANSVAGINAL ULTRASOUND OF PELVIS
TECHNIQUE: Both transabdominal and transvaginal ultrasound examinations of the
pelvis were performed. Transabdominal technique was performed for
global imaging of the pelvis including uterus, ovaries, adnexal
regions, and pelvic cul-de-sac. It was necessary to proceed with
endovaginal exam following the transabdominal exam to visualize the
uterus endometrium ovaries.

[Series 1: us pelvis complete with transvaginal · 0.22mm/px · 13 of 57 slices shown]
[im 1/57]
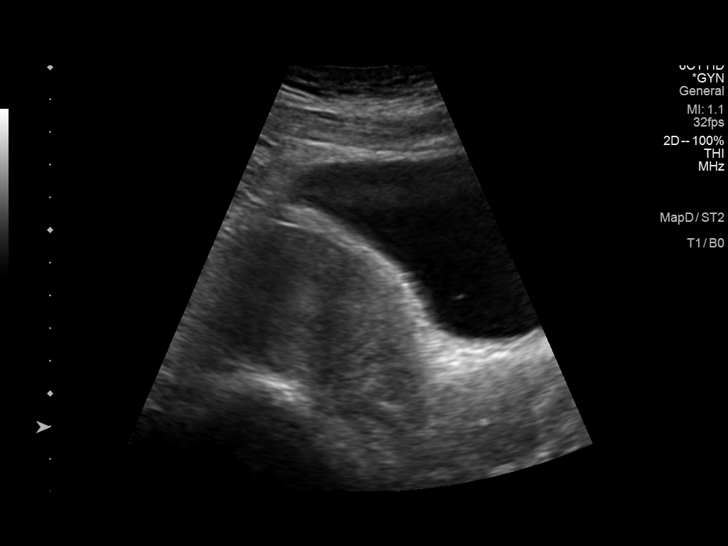
[im 5/57]
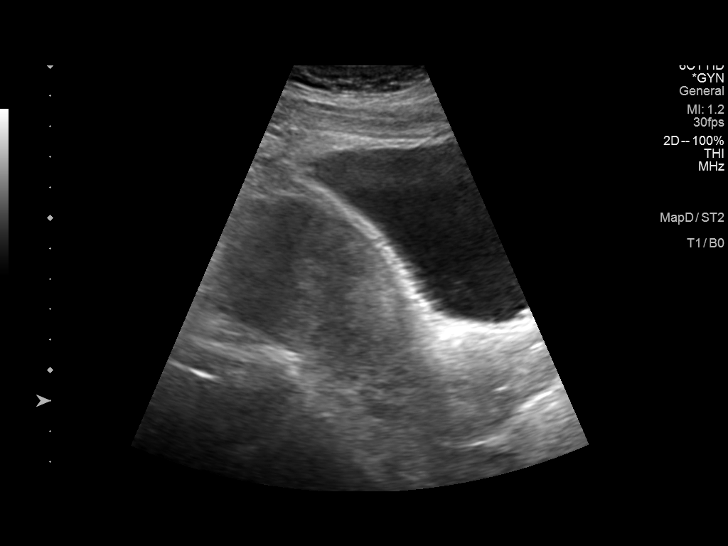
[im 10/57]
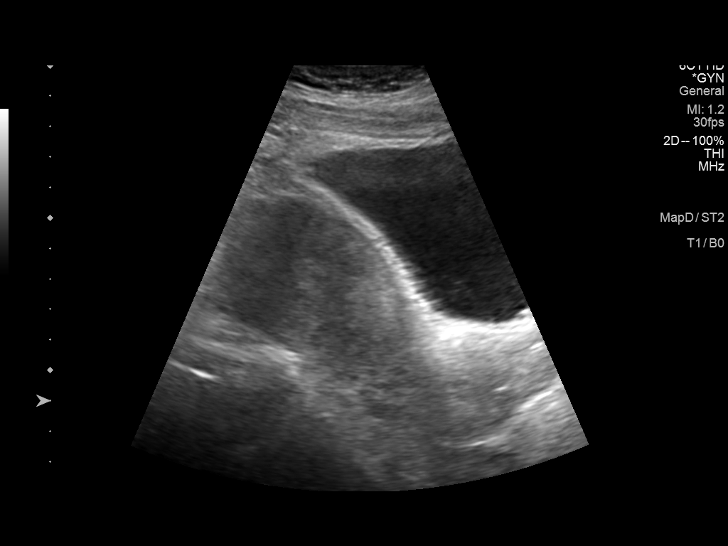
[im 15/57]
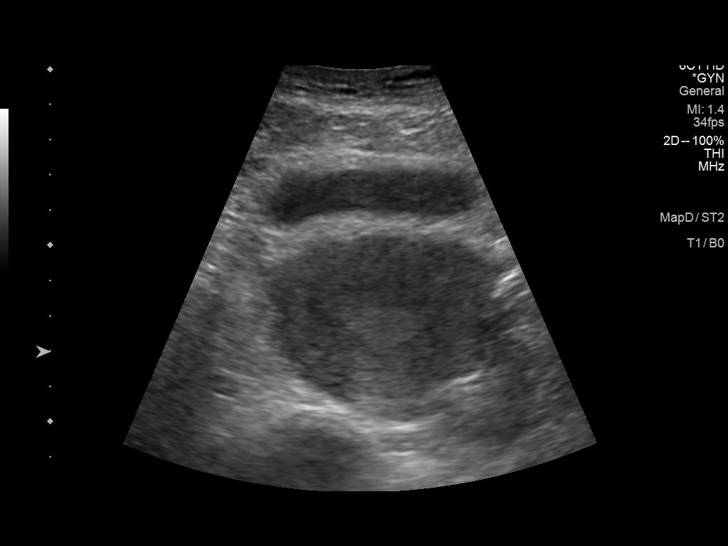
[im 19/57]
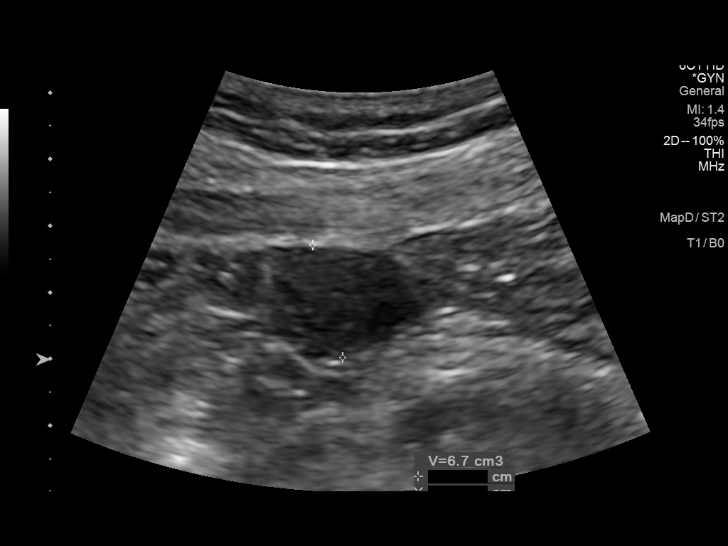
[im 24/57]
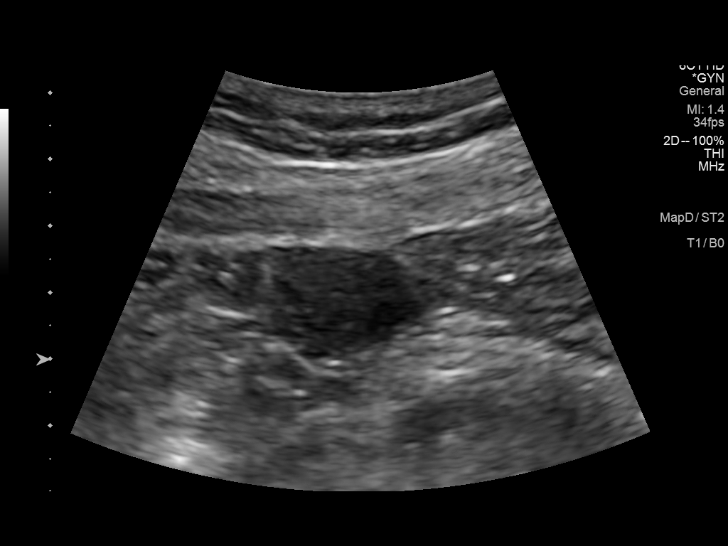
[im 29/57]
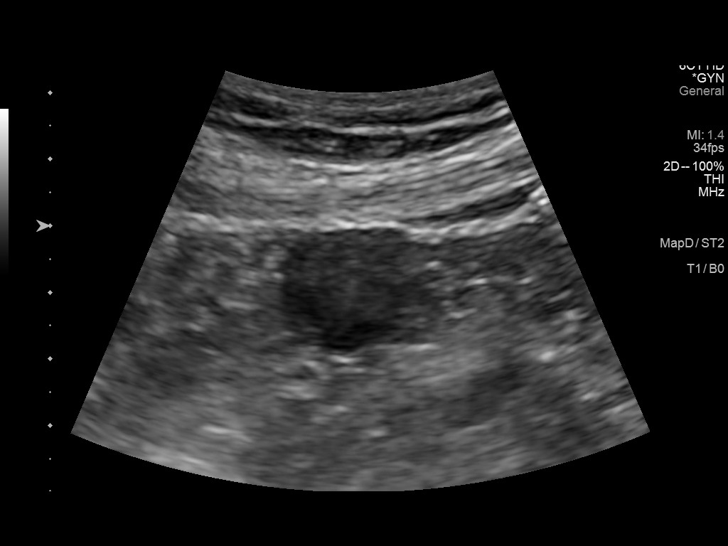
[im 33/57]
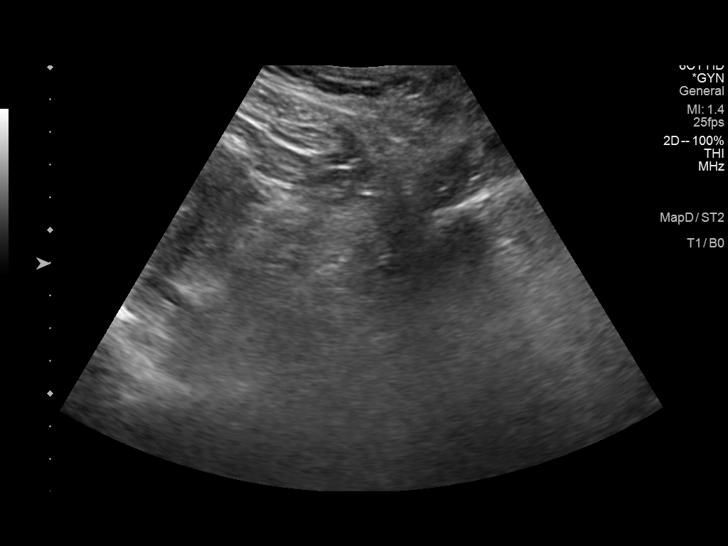
[im 38/57]
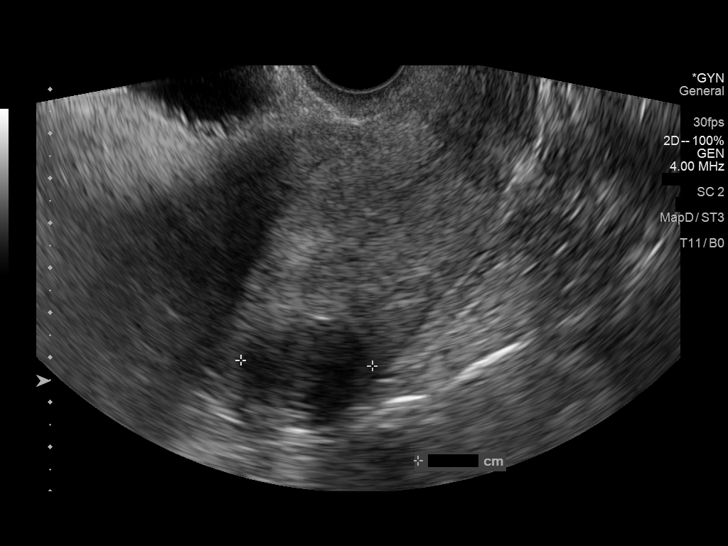
[im 43/57]
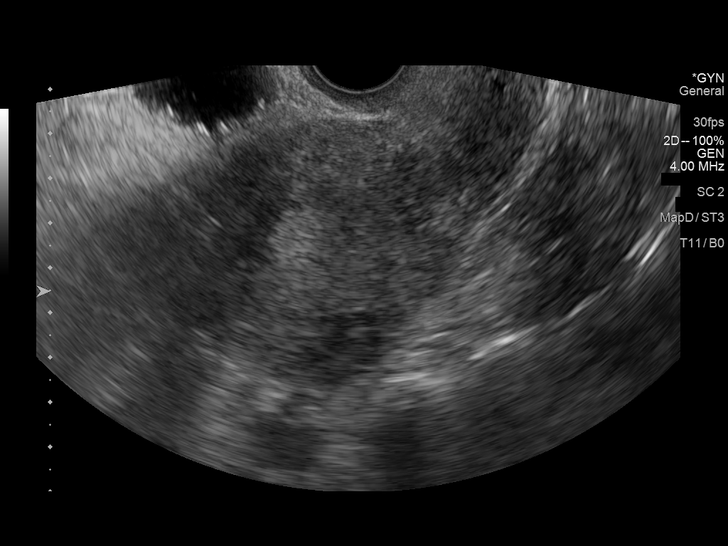
[im 47/57]
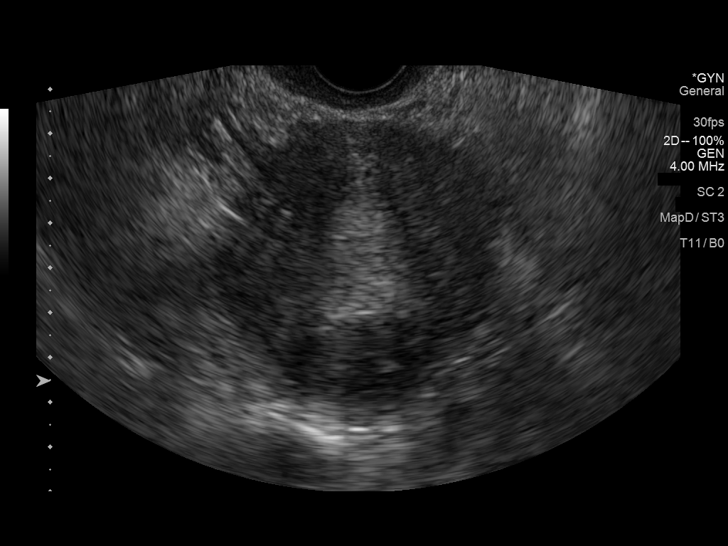
[im 52/57]
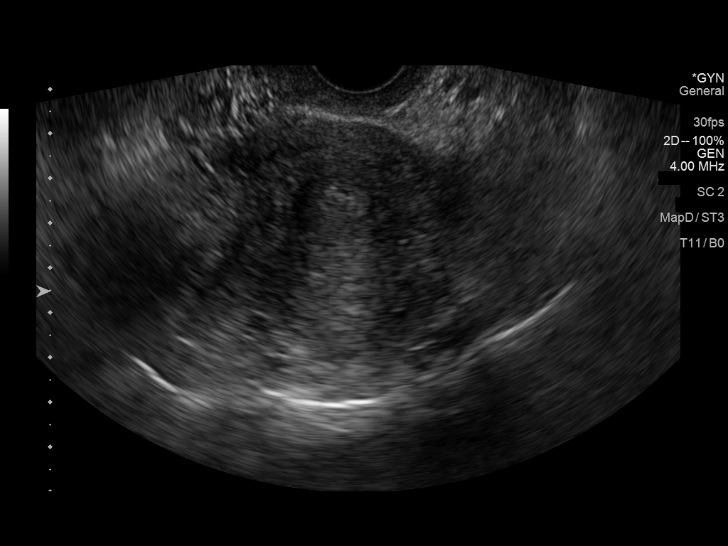
[im 57/57]
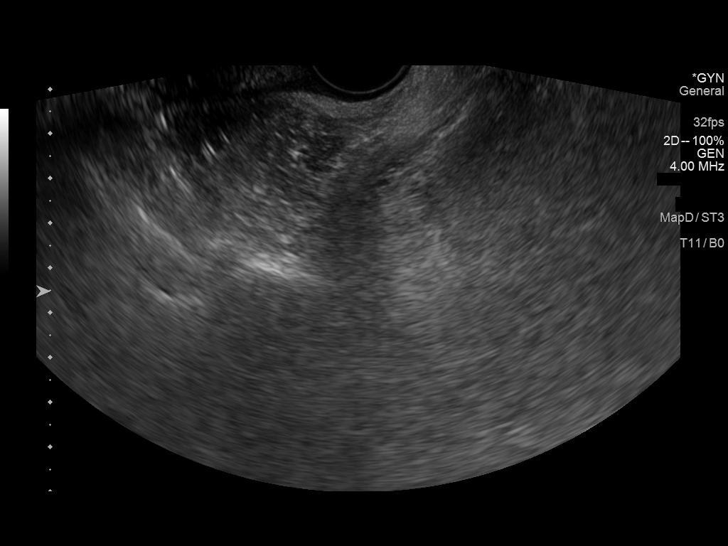

[13 of 25 positions shown; findings below may reference images not displayed]

FINDINGS: Uterus

Measurements: 11.2 x 4.6 x 6.4 cm = volume: 170.3 mL. Oval
hypoechoic mass within the posterior fundus measuring 2.9 x 2.2 x
3.1 cm.

Endometrium

Thickness: 14.6 mm.  No focal abnormality visualized.

Right ovary

Measurements: 3.9 x 1.9 x 1.8 cm = volume: 6.7 mL. Normal
appearance/no adnexal mass.

Left ovary

Nonvisualized

Other findings

No abnormal free fluid.
IMPRESSION: 1. Prominent uterine size with 3.1 cm posterior fundal mass
consistent with a fibroid.
2. Endometrial thickness of 14.6 mm. If bleeding remains
unresponsive to hormonal or medical therapy, sonohysterogram should
be considered for focal lesion work-up. (Ref: Radiological
Reasoning: Algorithmic Workup of Abnormal Vaginal Bleeding with
Endovaginal Sonography and Sonohysterography. AJR 1008; 191:S68-73)
3. Nonvisualized left ovary

## 2020-04-19 DIAGNOSIS — Z20822 Contact with and (suspected) exposure to covid-19: Secondary | ICD-10-CM | POA: Diagnosis not present

## 2020-07-04 ENCOUNTER — Ambulatory Visit (HOSPITAL_COMMUNITY): Admit: 2020-07-04 | Discharge status: Absent without leave | Payer: No Typology Code available for payment source

## 2020-12-02 DIAGNOSIS — Z113 Encounter for screening for infections with a predominantly sexual mode of transmission: Secondary | ICD-10-CM | POA: Diagnosis not present

## 2021-06-05 DIAGNOSIS — R000 Tachycardia, unspecified: Secondary | ICD-10-CM | POA: Diagnosis not present

## 2021-06-05 DIAGNOSIS — R1111 Vomiting without nausea: Secondary | ICD-10-CM | POA: Diagnosis not present

## 2021-06-05 DIAGNOSIS — Z03818 Encounter for observation for suspected exposure to other biological agents ruled out: Secondary | ICD-10-CM | POA: Diagnosis not present

## 2021-08-30 DIAGNOSIS — F419 Anxiety disorder, unspecified: Secondary | ICD-10-CM | POA: Diagnosis not present

## 2021-10-18 DIAGNOSIS — F329 Major depressive disorder, single episode, unspecified: Secondary | ICD-10-CM | POA: Diagnosis not present

## 2021-10-21 DIAGNOSIS — Z13220 Encounter for screening for lipoid disorders: Secondary | ICD-10-CM | POA: Diagnosis not present

## 2021-10-21 DIAGNOSIS — Z0000 Encounter for general adult medical examination without abnormal findings: Secondary | ICD-10-CM | POA: Diagnosis not present

## 2021-10-21 DIAGNOSIS — F329 Major depressive disorder, single episode, unspecified: Secondary | ICD-10-CM | POA: Diagnosis not present

## 2021-10-21 DIAGNOSIS — R7301 Impaired fasting glucose: Secondary | ICD-10-CM | POA: Diagnosis not present

## 2021-10-28 ENCOUNTER — Other Ambulatory Visit: Payer: Self-pay | Admitting: Family Medicine

## 2021-10-28 DIAGNOSIS — R748 Abnormal levels of other serum enzymes: Secondary | ICD-10-CM

## 2021-11-04 ENCOUNTER — Inpatient Hospital Stay: Admission: RE | Admit: 2021-11-04 | Payer: No Typology Code available for payment source | Source: Ambulatory Visit

## 2021-12-09 ENCOUNTER — Ambulatory Visit
Admission: RE | Admit: 2021-12-09 | Discharge: 2021-12-09 | Disposition: A | Discharge status: Home / Self Care | Payer: BC Managed Care – PPO | Source: Ambulatory Visit | Attending: Family Medicine | Admitting: Family Medicine

## 2021-12-09 DIAGNOSIS — R748 Abnormal levels of other serum enzymes: Secondary | ICD-10-CM

## 2022-01-18 DIAGNOSIS — M25571 Pain in right ankle and joints of right foot: Secondary | ICD-10-CM | POA: Diagnosis not present

## 2022-01-18 DIAGNOSIS — W109XXA Fall (on) (from) unspecified stairs and steps, initial encounter: Secondary | ICD-10-CM | POA: Diagnosis not present

## 2022-01-18 DIAGNOSIS — S99911A Unspecified injury of right ankle, initial encounter: Secondary | ICD-10-CM | POA: Diagnosis not present

## 2022-01-24 DIAGNOSIS — S93491A Sprain of other ligament of right ankle, initial encounter: Secondary | ICD-10-CM | POA: Diagnosis not present

## 2022-01-24 DIAGNOSIS — M25561 Pain in right knee: Secondary | ICD-10-CM | POA: Diagnosis not present

## 2022-03-03 DIAGNOSIS — S93491D Sprain of other ligament of right ankle, subsequent encounter: Secondary | ICD-10-CM | POA: Diagnosis not present

## 2022-05-26 DIAGNOSIS — M5442 Lumbago with sciatica, left side: Secondary | ICD-10-CM | POA: Diagnosis not present

## 2022-09-08 DIAGNOSIS — Z136 Encounter for screening for cardiovascular disorders: Secondary | ICD-10-CM | POA: Diagnosis not present

## 2022-09-08 DIAGNOSIS — G609 Hereditary and idiopathic neuropathy, unspecified: Secondary | ICD-10-CM | POA: Diagnosis not present

## 2022-09-08 DIAGNOSIS — R350 Frequency of micturition: Secondary | ICD-10-CM | POA: Diagnosis not present

## 2022-09-08 DIAGNOSIS — R631 Polydipsia: Secondary | ICD-10-CM | POA: Diagnosis not present

## 2022-09-16 IMAGING — US US ABDOMEN LIMITED
1 series · 14 of 25 positions shown · non-contrast
Comparison: CT abdomen and pelvis 04/07/2013, abdominal ultrasound
12/14/2014

CLINICAL DATA: Elevated liver enzymes

EXAM:
ULTRASOUND ABDOMEN LIMITED RIGHT UPPER QUADRANT

[Series 1: us abdomen limited · 0.25mm/px · 14 of 37 slices shown]
[im 1/37]
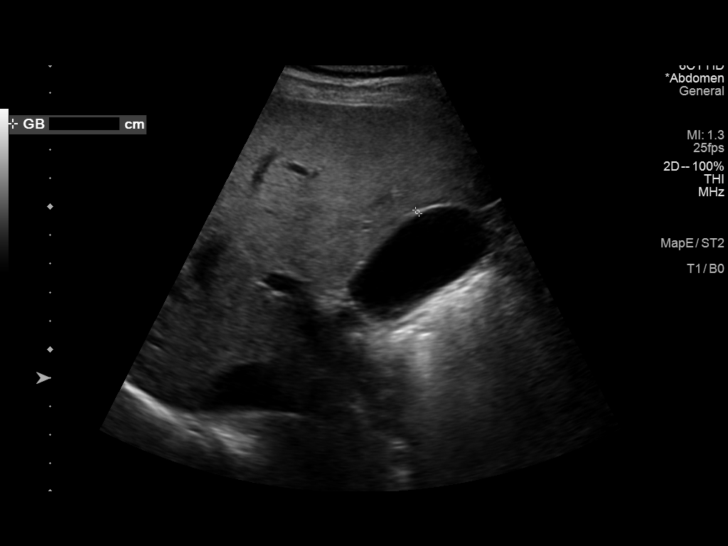
[im 4/37]
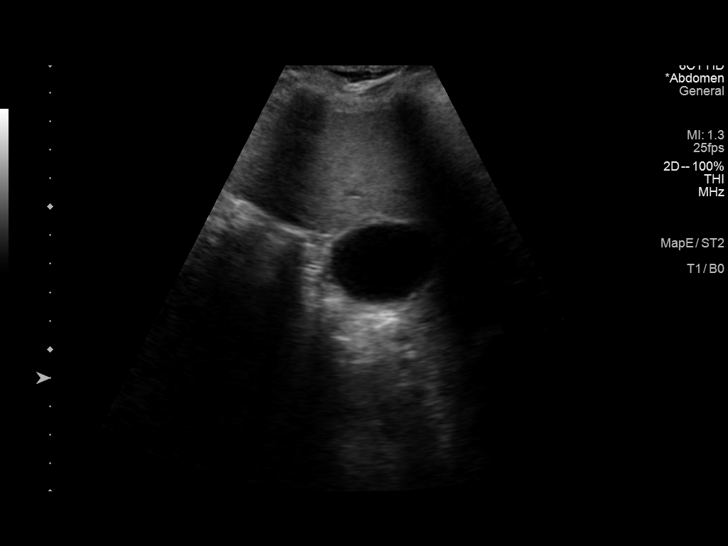
[im 7/37]
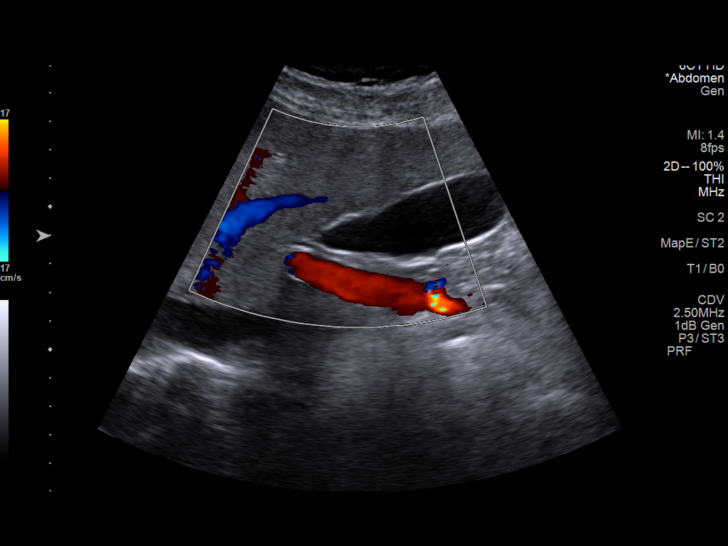
[im 10/37]
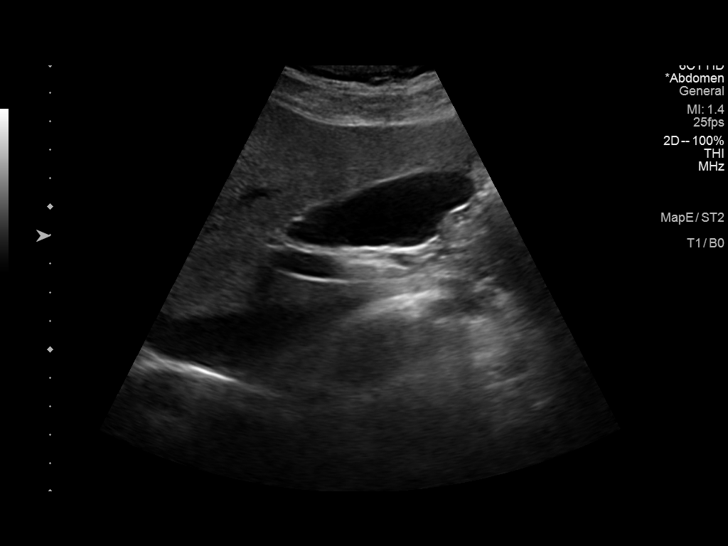
[im 13/37]
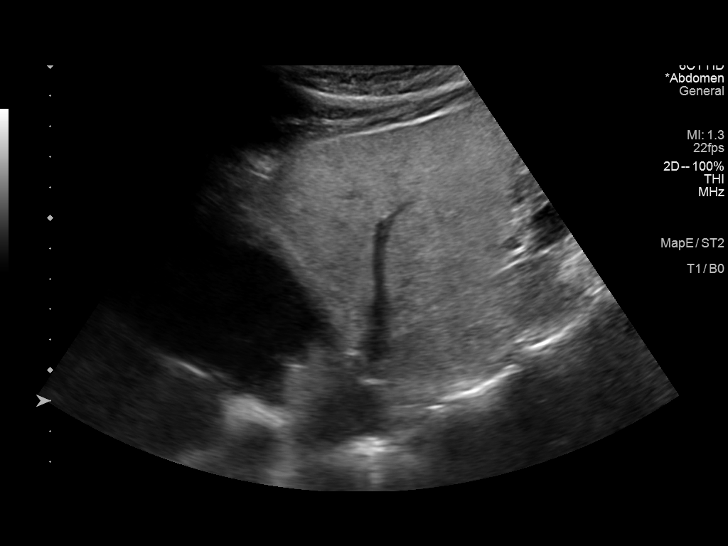
[im 14/37]
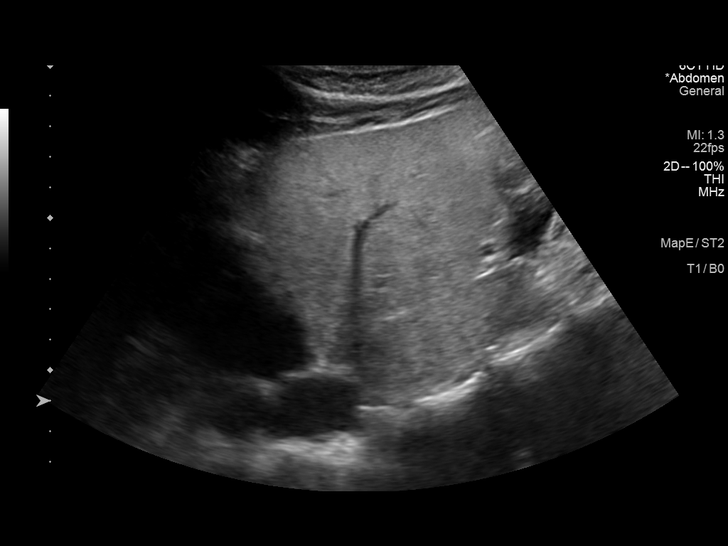
[im 17/37]
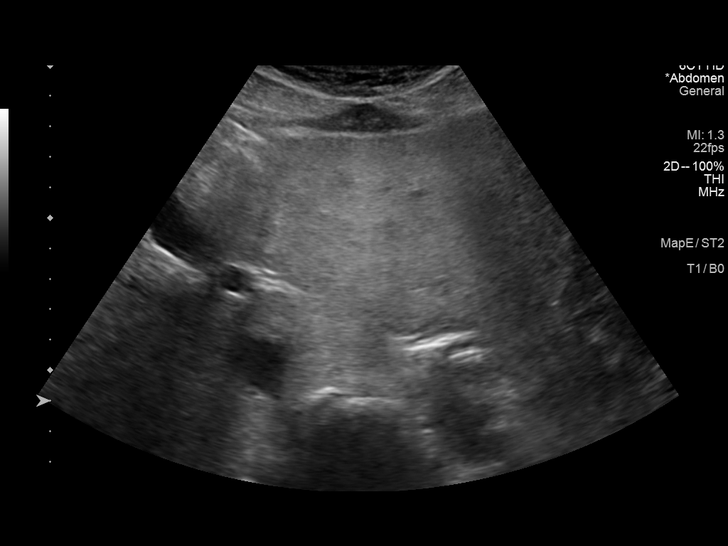
[im 20/37]
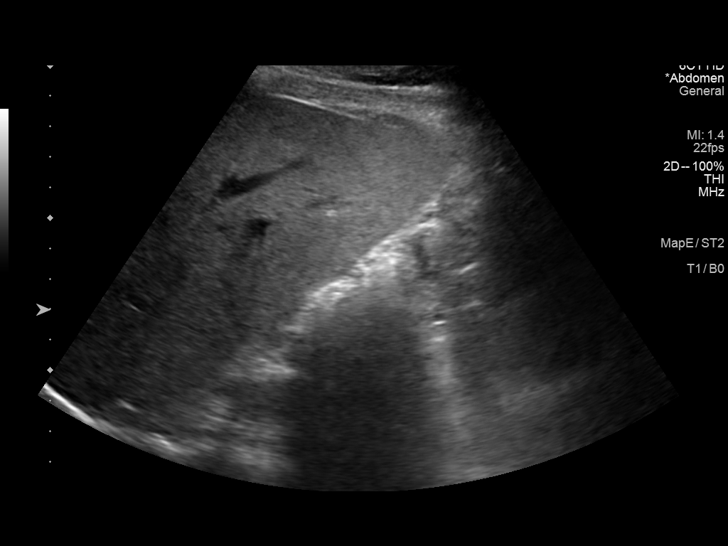
[im 23/37]
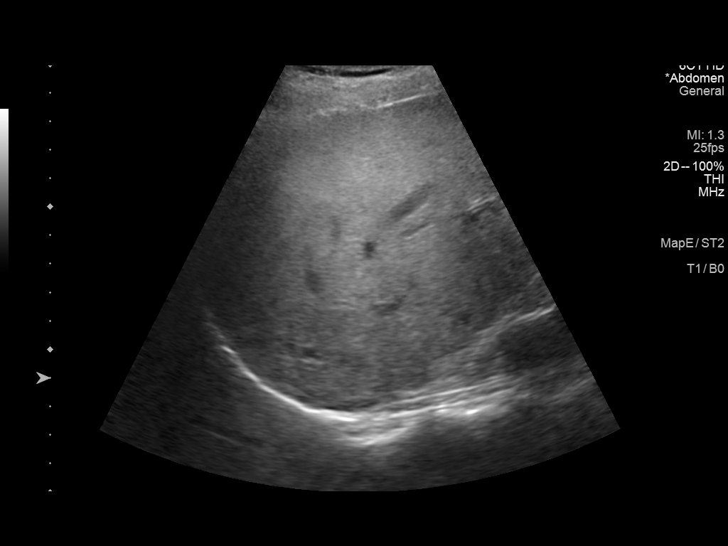
[im 25/37]
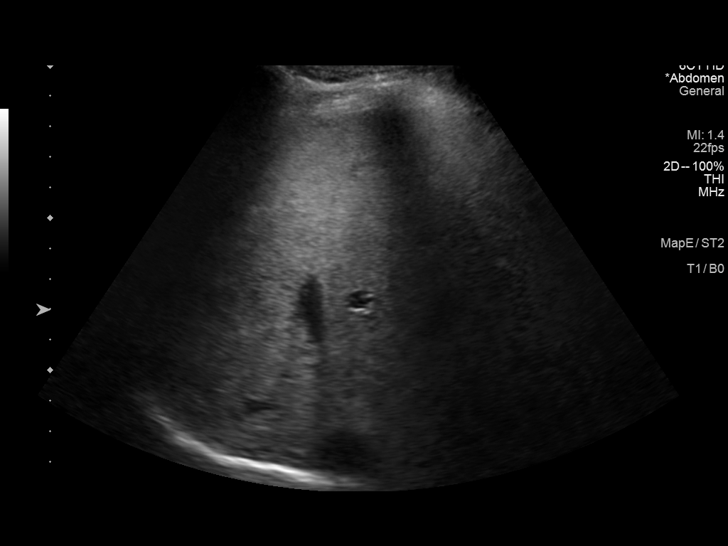
[im 28/37]
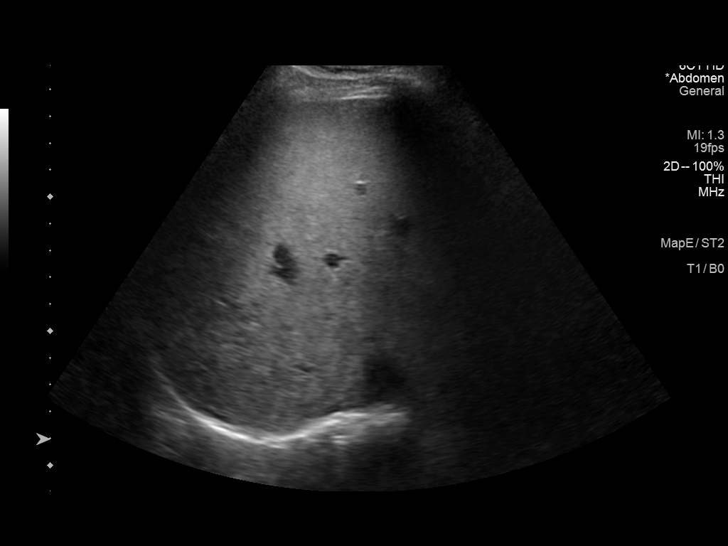
[im 31/37]
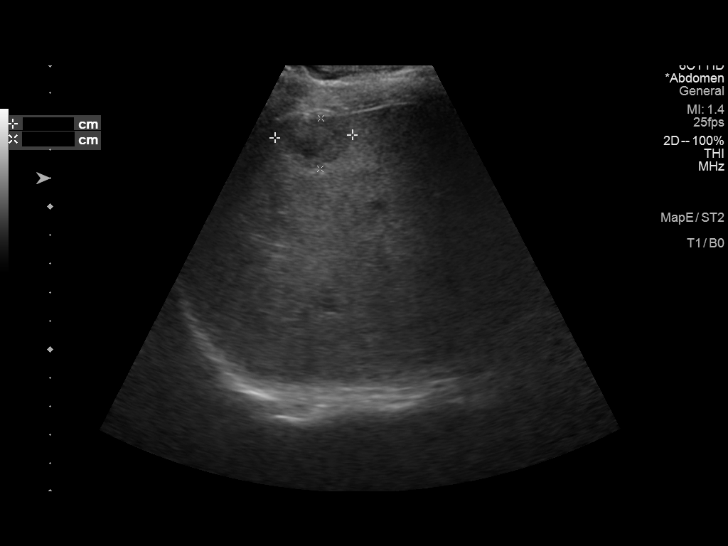
[im 34/37]
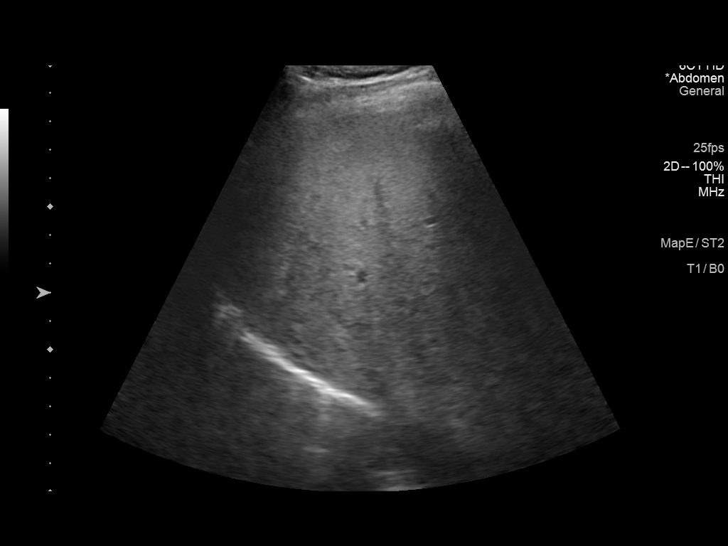
[im 37/37]
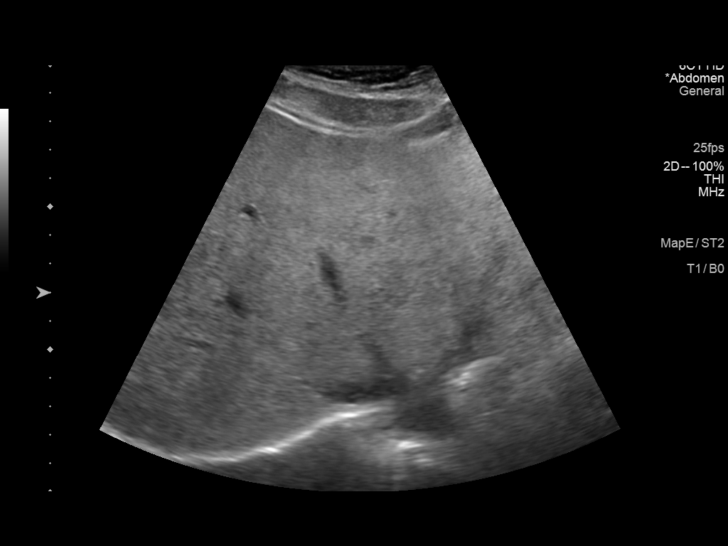

[14 of 25 positions shown; findings below may reference images not displayed]

FINDINGS: Gallbladder:

No gallstones or wall thickening visualized. No sonographic Murphy
sign noted by sonographer.

Common bile duct:

Diameter: 3 mm

Liver:

Diffuse increased echogenicity of the parenchyma. Stable sized
hypoechoic mass in the anterior right hepatic lobe measuring 2.7 x 2
x 1.8 cm, described as hemangioma on previous CT. Portal vein is
patent on color Doppler imaging with normal direction of blood flow
towards the liver.

Other: None.
IMPRESSION: 1. Diffuse increased echogenicity of the liver parenchyma suggesting
hepatic steatosis and/or other hepatocellular disease.
2. Stable sized hypoechoic mass in the anterior right hepatic lobe
which has been described as hemangioma on previous CT.

## 2022-09-20 DIAGNOSIS — F32A Depression, unspecified: Secondary | ICD-10-CM | POA: Diagnosis not present

## 2022-09-20 DIAGNOSIS — D509 Iron deficiency anemia, unspecified: Secondary | ICD-10-CM | POA: Diagnosis not present

## 2022-09-20 DIAGNOSIS — F1020 Alcohol dependence, uncomplicated: Secondary | ICD-10-CM | POA: Diagnosis not present

## 2022-09-20 DIAGNOSIS — G609 Hereditary and idiopathic neuropathy, unspecified: Secondary | ICD-10-CM | POA: Diagnosis not present

## 2022-09-25 DIAGNOSIS — R000 Tachycardia, unspecified: Secondary | ICD-10-CM | POA: Diagnosis not present

## 2022-09-25 DIAGNOSIS — R4586 Emotional lability: Secondary | ICD-10-CM | POA: Diagnosis not present

## 2022-09-25 DIAGNOSIS — F332 Major depressive disorder, recurrent severe without psychotic features: Secondary | ICD-10-CM | POA: Diagnosis not present

## 2022-09-25 DIAGNOSIS — F10129 Alcohol abuse with intoxication, unspecified: Secondary | ICD-10-CM | POA: Diagnosis not present

## 2022-09-25 DIAGNOSIS — Y908 Blood alcohol level of 240 mg/100 ml or more: Secondary | ICD-10-CM | POA: Diagnosis not present

## 2022-09-25 DIAGNOSIS — F39 Unspecified mood [affective] disorder: Secondary | ICD-10-CM | POA: Diagnosis not present

## 2022-09-25 DIAGNOSIS — R45851 Suicidal ideations: Secondary | ICD-10-CM | POA: Diagnosis not present

## 2022-09-25 DIAGNOSIS — F32A Depression, unspecified: Secondary | ICD-10-CM | POA: Diagnosis not present

## 2022-09-26 DIAGNOSIS — Z791 Long term (current) use of non-steroidal anti-inflammatories (NSAID): Secondary | ICD-10-CM | POA: Diagnosis not present

## 2022-09-26 DIAGNOSIS — Y908 Blood alcohol level of 240 mg/100 ml or more: Secondary | ICD-10-CM | POA: Diagnosis not present

## 2022-09-26 DIAGNOSIS — F322 Major depressive disorder, single episode, severe without psychotic features: Secondary | ICD-10-CM | POA: Diagnosis not present

## 2022-09-26 DIAGNOSIS — F332 Major depressive disorder, recurrent severe without psychotic features: Secondary | ICD-10-CM | POA: Diagnosis not present

## 2022-09-26 DIAGNOSIS — F10129 Alcohol abuse with intoxication, unspecified: Secondary | ICD-10-CM | POA: Diagnosis not present

## 2022-09-26 DIAGNOSIS — R45851 Suicidal ideations: Secondary | ICD-10-CM | POA: Diagnosis not present

## 2022-09-26 DIAGNOSIS — F10229 Alcohol dependence with intoxication, unspecified: Secondary | ICD-10-CM | POA: Diagnosis not present

## 2022-09-26 DIAGNOSIS — F32A Depression, unspecified: Secondary | ICD-10-CM | POA: Diagnosis not present

## 2022-09-26 DIAGNOSIS — Z79899 Other long term (current) drug therapy: Secondary | ICD-10-CM | POA: Diagnosis not present

## 2022-09-26 DIAGNOSIS — R000 Tachycardia, unspecified: Secondary | ICD-10-CM | POA: Diagnosis not present

## 2022-09-26 DIAGNOSIS — Z634 Disappearance and death of family member: Secondary | ICD-10-CM | POA: Diagnosis not present
